# Patient Record
Sex: Female | Born: 1935 | Race: White | Hispanic: No | Marital: Married | Smoking: Former smoker
Health system: Southern US, Community
[De-identification: ages and names within clinical notes are randomized; demographics above are authoritative.]

## PROBLEM LIST (undated history)

## (undated) DIAGNOSIS — H269 Unspecified cataract: Secondary | ICD-10-CM

## (undated) DIAGNOSIS — Z9889 Other specified postprocedural states: Secondary | ICD-10-CM

## (undated) DIAGNOSIS — Z8719 Personal history of other diseases of the digestive system: Secondary | ICD-10-CM

## (undated) DIAGNOSIS — C3490 Malignant neoplasm of unspecified part of unspecified bronchus or lung: Secondary | ICD-10-CM

## (undated) HISTORY — DX: Unspecified cataract: H26.9

## (undated) HISTORY — PX: BLADDER SUSPENSION: SHX72

## (undated) HISTORY — DX: Malignant neoplasm of unspecified part of unspecified bronchus or lung: C34.90

## (undated) HISTORY — PX: ABDOMINAL HYSTERECTOMY: SHX81

## (undated) HISTORY — PX: CATARACT EXTRACTION: SUR2

## (undated) HISTORY — DX: Personal history of other diseases of the digestive system: Z87.19

## (undated) HISTORY — PX: HERNIA REPAIR: SHX51

## (undated) HISTORY — DX: Other specified postprocedural states: Z98.890

---

## 2014-06-13 ENCOUNTER — Other Ambulatory Visit: Payer: Self-pay | Admitting: Family Medicine

## 2014-06-13 ENCOUNTER — Ambulatory Visit
Admission: RE | Admit: 2014-06-13 | Discharge: 2014-06-13 | Disposition: A | Discharge status: Home / Self Care | Payer: Medicare Other | Source: Ambulatory Visit | Attending: Family Medicine | Admitting: Family Medicine

## 2014-06-13 DIAGNOSIS — R059 Cough, unspecified: Secondary | ICD-10-CM

## 2014-06-13 DIAGNOSIS — R05 Cough: Secondary | ICD-10-CM

## 2014-06-15 ENCOUNTER — Other Ambulatory Visit: Payer: Self-pay | Admitting: Family Medicine

## 2014-06-15 DIAGNOSIS — R9389 Abnormal findings on diagnostic imaging of other specified body structures: Secondary | ICD-10-CM

## 2014-06-19 ENCOUNTER — Ambulatory Visit
Admission: RE | Admit: 2014-06-19 | Discharge: 2014-06-19 | Disposition: A | Discharge status: Home / Self Care | Payer: Medicare Other | Source: Ambulatory Visit | Attending: Family Medicine | Admitting: Family Medicine

## 2014-06-19 DIAGNOSIS — R9389 Abnormal findings on diagnostic imaging of other specified body structures: Secondary | ICD-10-CM

## 2014-06-19 MED ORDER — IOHEXOL 300 MG/ML  SOLN
75.0000 mL | Freq: Once | INTRAMUSCULAR | Status: AC | PRN
  Administered 2014-06-19: 75 mL via INTRAVENOUS

## 2014-06-25 ENCOUNTER — Other Ambulatory Visit (HOSPITAL_COMMUNITY): Payer: Self-pay | Admitting: Family Medicine

## 2014-06-25 ENCOUNTER — Telehealth: Payer: Self-pay | Admitting: *Deleted

## 2014-06-25 ENCOUNTER — Encounter: Payer: Self-pay | Admitting: *Deleted

## 2014-06-25 DIAGNOSIS — R918 Other nonspecific abnormal finding of lung field: Secondary | ICD-10-CM

## 2014-06-25 NOTE — Telephone Encounter (Signed)
Called daughter per referral form request.  I left a vm message to call with my name and phone number

## 2014-06-25 NOTE — [facility] Oncology Navigator Note (Unsigned)
Daughter called me back.  She stated Lindsey Fleming does not want to be seen at this time.  I stated we are here if she changes her mind.  I called the referring office to notify.  They stated the daughter had called and stated the same thing.  I asked that they call me back if Lindsey Fleming changes her mind. Beverlee Nims stated she would.

## 2014-06-29 ENCOUNTER — Ambulatory Visit (HOSPITAL_COMMUNITY): Payer: Medicare Other

## 2014-09-13 ENCOUNTER — Other Ambulatory Visit (HOSPITAL_COMMUNITY): Payer: Self-pay | Admitting: Family Medicine

## 2014-09-13 DIAGNOSIS — R918 Other nonspecific abnormal finding of lung field: Secondary | ICD-10-CM

## 2014-09-14 ENCOUNTER — Telehealth: Payer: Self-pay | Admitting: *Deleted

## 2014-09-14 NOTE — Telephone Encounter (Signed)
Called patient to schedule appt.  I left vm message to call.    I called daughter whom I spoke with in February about appt.  She states mother does not want to schedule until after PET scan.  I gave her my phone number to call if she for when she would like to schedule.

## 2014-09-21 ENCOUNTER — Ambulatory Visit (HOSPITAL_COMMUNITY): Payer: Medicare Other

## 2014-10-05 ENCOUNTER — Ambulatory Visit (HOSPITAL_COMMUNITY): Payer: Medicare Other

## 2014-10-08 ENCOUNTER — Telehealth: Payer: Self-pay | Admitting: *Deleted

## 2014-10-08 NOTE — Telephone Encounter (Signed)
Oncology Nurse Navigator Documentation  Oncology Nurse Navigator Flowsheets 10/08/2014  Navigator Encounter Type Other  Patient Visit Type Initial  Treatment Phase Other  Barriers/Navigation Needs Education  Education Other  Interventions Coordination of Care.  Called daughter to arrange appt to see Dr. Julien Nordmann.  I left vm message with my name and phone number to call.  I also noted that PET scan was scheduled on 09/21/14 was cancelled and 10/05/14 was cancelled as well.    Coordination of Care MD Appointments  Time Spent with Patient 15

## 2014-11-06 ENCOUNTER — Telehealth: Payer: Self-pay | Admitting: *Deleted

## 2014-11-06 NOTE — Telephone Encounter (Signed)
Oncology Nurse Navigator Documentation  Oncology Nurse Navigator Flowsheets 11/06/2014  Navigator Encounter Type Telephone/I called daughter to follow up regarding referral.  I left vm message to call if needed.  I also call the referring office.  They stated they have closed out the referral and patient's daughter will follow up and let them know if they change their mind about referral.  I stated just let me know if I can help.    Treatment Phase Abnormal scans  Time Spent with Patient 15

## 2014-11-08 ENCOUNTER — Telehealth: Payer: Self-pay | Admitting: *Deleted

## 2014-11-08 NOTE — Telephone Encounter (Signed)
Oncology Nurse Navigator Documentation  Oncology Nurse Navigator Flowsheets 11/08/2014  Navigator Encounter Type Telephone  Treatment Phase Abnormal Scans  Interventions   Coordination of Care MD Appointments/I received a call from patient's daughter who would like to schedule an appt for her mother to see Dr. Julien Nordmann.  I called her back to schedule and was unable to leave a vm message.    Time Spent with Patient 15

## 2014-11-12 ENCOUNTER — Telehealth: Payer: Self-pay | Admitting: *Deleted

## 2014-11-12 NOTE — Telephone Encounter (Signed)
Daughter called and left message requesting to speak with Hinton Dyer, nurse lung navigator.  Spoke with daughter [name], and was informed that ever since pt received the diagnosis ,  pt has been in denial, and would not want to come in for office visit.  Now pt has seen blood in sputum again, pt voiced concerns and would like to have office follow up with Dr. Julien Nordmann. [facility]  Phone    [phone].

## 2014-11-13 ENCOUNTER — Telehealth: Payer: Self-pay | Admitting: Internal Medicine

## 2014-11-13 ENCOUNTER — Encounter: Payer: Self-pay | Admitting: *Deleted

## 2014-11-13 NOTE — Telephone Encounter (Signed)
NEW PATIENT APPT-S/W PATIENT DTR CAROL HARDEN AND GAVE NP APPT FOR 07/13 @ 1:45 W/DR. MOHAMED.

## 2014-11-13 NOTE — Progress Notes (Signed)
Oncology Nurse Navigator Documentation  Oncology Nurse Navigator Flowsheets 11/13/2014  Navigator Encounter Type Telephone/I received a message from triage stating patient's was coughing up blood.  I called daughter and left a vm message.  I told her that she should take her mom to the ED for evaluation.  I left [place] phone number to call if needed.    Treatment Phase Abnormal Scans  Barriers/Navigation Needs I have tried to set patient up for an appt since Feb. This year.  Unfortunately, patient has refused.  I will update HIM to schedule   Coordination of Care MD Appointments  Time Spent with Patient 15

## 2014-11-28 ENCOUNTER — Other Ambulatory Visit: Payer: Self-pay | Admitting: Medical Oncology

## 2014-11-28 ENCOUNTER — Other Ambulatory Visit: Payer: Self-pay | Admitting: Internal Medicine

## 2014-11-28 ENCOUNTER — Ambulatory Visit (HOSPITAL_BASED_OUTPATIENT_CLINIC_OR_DEPARTMENT_OTHER): Payer: Medicare Other | Admitting: Internal Medicine

## 2014-11-28 ENCOUNTER — Telehealth: Payer: Self-pay | Admitting: Internal Medicine

## 2014-11-28 ENCOUNTER — Other Ambulatory Visit (HOSPITAL_BASED_OUTPATIENT_CLINIC_OR_DEPARTMENT_OTHER): Payer: Medicare Other

## 2014-11-28 ENCOUNTER — Ambulatory Visit: Payer: Medicare Other

## 2014-11-28 ENCOUNTER — Encounter: Payer: Self-pay | Admitting: Internal Medicine

## 2014-11-28 VITALS — BP 147/73 | HR 131 | Temp 99.0°F | Resp 17 | Ht 65.0 in | Wt 157.4 lb

## 2014-11-28 DIAGNOSIS — J984 Other disorders of lung: Secondary | ICD-10-CM

## 2014-11-28 DIAGNOSIS — R918 Other nonspecific abnormal finding of lung field: Secondary | ICD-10-CM

## 2014-11-28 LAB — COMPREHENSIVE METABOLIC PANEL (CC13)
ALT: 7 U/L (ref 0–55)
AST: 11 U/L (ref 5–34)
Albumin: 3.1 g/dL — ABNORMAL LOW (ref 3.5–5.0)
Alkaline Phosphatase: 114 U/L (ref 40–150)
Anion Gap: 5 mEq/L (ref 3–11)
BUN: 14.8 mg/dL (ref 7.0–26.0)
CALCIUM: 9.2 mg/dL (ref 8.4–10.4)
CHLORIDE: 102 meq/L (ref 98–109)
CO2: 28 meq/L (ref 22–29)
CREATININE: 0.7 mg/dL (ref 0.6–1.1)
EGFR: 75 mL/min/{1.73_m2} — ABNORMAL LOW (ref >90)
GLUCOSE: 88 mg/dL (ref 70–140)
Potassium: 4.7 mEq/L (ref 3.5–5.1)
Sodium: 136 mEq/L (ref 136–145)
Total Bilirubin: 0.25 mg/dL (ref 0.20–1.20)
Total Protein: 7.4 g/dL (ref 6.4–8.3)

## 2014-11-28 LAB — CBC WITH DIFFERENTIAL/PLATELET
BASO%: 0.3 % (ref 0.0–2.0)
BASOS ABS: 0 10*3/uL (ref 0.0–0.1)
EOS ABS: 0.4 10*3/uL (ref 0.0–0.5)
EOS%: 3.5 % (ref 0.0–7.0)
HCT: 33.3 % — ABNORMAL LOW (ref 34.8–46.6)
HGB: 10.6 g/dL — ABNORMAL LOW (ref 11.6–15.9)
LYMPH%: 20.4 % (ref 14.0–49.7)
MCH: 28 pg (ref 25.1–34.0)
MCHC: 31.8 g/dL (ref 31.5–36.0)
MCV: 88.1 fL (ref 79.5–101.0)
MONO#: 1.1 10*3/uL — ABNORMAL HIGH (ref 0.1–0.9)
MONO%: 9.9 % (ref 0.0–14.0)
NEUT#: 7 10*3/uL — ABNORMAL HIGH (ref 1.5–6.5)
NEUT%: 65.9 % (ref 38.4–76.8)
Platelets: 451 10*3/uL — ABNORMAL HIGH (ref 145–400)
RBC: 3.78 10*6/uL (ref 3.70–5.45)
RDW: 15.6 % — AB (ref 11.2–14.5)
WBC: 10.6 10*3/uL — ABNORMAL HIGH (ref 3.9–10.3)
lymph#: 2.2 10*3/uL (ref 0.9–3.3)

## 2014-11-28 NOTE — Telephone Encounter (Signed)
cld Dr Elsworth Soho office they stated they have in [place] and they will call pt after Dr Elsworth Soho reads note to sch appt

## 2014-11-28 NOTE — Progress Notes (Signed)
[name]   [phone]  CONSULT NOTE  REFERRING PHYSICIAN: Dr. Gaynelle Fleming  REASON FOR CONSULTATION:  81 years old white female with large left lung mass suspicious for lung cancer.  HPI Lindsey Fleming is a 81 y.o. female with no significant past medical history and remote history of smoking for around 10 years but quit in 1974 who was evaluated by her primary care physician Dr. Marisue Fleming in January 2016 for complaining of chest tightness as well as cough with occasional hemoptysis. She also had some weight loss at that time. Chest x-ray performed on 06/13/2014 showed large irregular left upper lobe mass probably extending into the mediastinum consistent with carcinoma of the lung. This was followed by CT scan of the chest on 06/19/2014 and it showed a partially necrotic 9.9 x 8.7 x 5.7 cm spiculated mass in the medial aspect of the left upper lobe. The mass invades the anterior and middle mediastinum. The fat planes between the mass and the aortic arch and the aortopulmonary window and between the mass and the left main pulmonary artery are obliterated. The mass encases the bronchus to the superior aspect of the leftupper lobe and abuts the bronchus to the lingula of the left upper lobe. There is slight peripheral nodularity in the left lung apex which probably represents tumor.There is a subcarinal nodal mass 16 x 21 x 38 mm. There is an 8 mm node in the anterior mediastinum which is probably metastatic disease. There is several small nodes in the azygos region which are indeterminate in etiology. The patient was referred to me for evaluation of her condition but unfortunately she missed her appointment several times and was on denial that she has anything concerning. She recently had increased hemoptysis and she requested reevaluation. When seen today she is feeling fine except for fatigue as well as shortness breath with exertion and  occasional hemoptysis. She still have cough productive of yellowish sputum. She lost a few pounds recently. The patient denied having any headache or visual changes. Family history significant for a mother who died from headache and probably brain aneurysm. Father had stroke and abdominal aneurysm. She had a sister with bladder cancer, another sister with lymphoma and a brother with lung cancer. The patient is a widow and has 3 children. She was accompanied today by her daughter Lindsey Fleming. She was a housewife. She has a history of smoking 1 pack per day for around 10 years but quit in 1974. She has no history of alcohol or drug abuse.   HPI  Past Medical History  Diagnosis Date  . Cataract   . H/O hernia repair     Past Surgical History  Procedure Laterality Date  . Abdominal hysterectomy      Family History  Problem Relation Age of Onset  . Stroke Father   . Cancer Sister   . Cancer Brother     Social History History  Substance Use Topics  . Smoking status: Former Smoker -- 0.50 packs/day for 10 years    Quit date: 11/27/1972  . Smokeless tobacco: Never Used  . Alcohol Use: No    Not on File  Current Outpatient Prescriptions  Medication Sig Dispense Refill  . acetaminophen (TYLENOL) 650 MG CR tablet Take 650 mg by mouth every 8 (eight) hours as needed for pain.    . Pseudoeph-Doxylamine-DM-APAP (NYQUIL MULTI-SYMPTOM PO) Take by mouth as needed.     No current facility-administered medications for this visit.  Review of Systems  Constitutional: positive for fatigue and weight loss Eyes: negative Ears, nose, mouth, throat, and face: negative Respiratory: positive for cough, dyspnea on exertion, hemoptysis and sputum Cardiovascular: negative Gastrointestinal: negative Genitourinary:negative Integument/breast: negative Hematologic/lymphatic: negative Musculoskeletal:positive for muscle weakness Neurological: negative Behavioral/Psych: negative Endocrine:  negative Allergic/Immunologic: negative  Physical Exam  SHF:WYOVZ, healthy, no distress, well nourished, well developed and anxious SKIN: skin color, texture, turgor are normal, no rashes or significant lesions HEAD: Normocephalic, No masses, lesions, tenderness or abnormalities EYES: normal, PERRLA, Conjunctiva are pink and non-injected EARS: External ears normal, Canals clear OROPHARYNX:no exudate, no erythema and lips, buccal mucosa, and tongue normal  NECK: supple, no adenopathy, no JVD LYMPH:  no palpable lymphadenopathy, no hepatosplenomegaly BREAST:not examined LUNGS: clear to auscultation , and palpation HEART: regular rate & rhythm, no murmurs and no gallops ABDOMEN:abdomen soft, non-tender, normal bowel sounds and no masses or organomegaly BACK: Back symmetric, no curvature., No CVA tenderness EXTREMITIES:no joint deformities, effusion, or inflammation, no edema, no skin discoloration, no clubbing  NEURO: alert & oriented x 3 with fluent speech, no focal motor/sensory deficits  PERFORMANCE STATUS: ECOG 1  LABORATORY DATA: Lab Results  Component Value Date   WBC 10.6* 11/28/2014   HGB 10.6* 11/28/2014   HCT 33.3* 11/28/2014   MCV 88.1 11/28/2014   PLT 451* 11/28/2014      Chemistry      Component Value Date/Time   NA 136 11/28/2014 1349   K 4.7 11/28/2014 1349   CO2 28 11/28/2014 1349   BUN 14.8 11/28/2014 1349   CREATININE 0.7 11/28/2014 1349      Component Value Date/Time   CALCIUM 9.2 11/28/2014 1349   ALKPHOS 114 11/28/2014 1349   AST 11 11/28/2014 1349   ALT 7 11/28/2014 1349   BILITOT 0.25 11/28/2014 1349       RADIOGRAPHIC STUDIES: No results found.  ASSESSMENT: This is a very pleasant 81 years old white female with highly suspicious stage IIIA (T3, N2, M0) non-small cell lung cancer, pending tissue diagnosis presented with a large left upper lobe mass with questionable mediastinal invasion as well as mediastinal lymphadenopathy.   PLAN: I had  a lengthy discussion with the patient and her daughter today about her current condition and further investigation to confirm her diagnosis. I will complete the staging workup by ordering a PET scan as well as MRI of the brain. I will also refer the patient to Dr. Elsworth Soho for consideration of bronchoscopy with endobronchial ultrasound and biopsy. I would see the patient back for follow-up visit in 2 weeks for reevaluation and detailed discussion of her treatment options based on the final pathology and staging workup. The patient was advised to call immediately if she has any concerning symptoms in the interval. The patient voices understanding of current disease status and treatment options and is in agreement with the current care plan.  All questions were answered. The patient knows to call the clinic with any problems, questions or concerns. We can certainly see the patient much sooner if necessary.  Thank you so much for allowing me to participate in the care of Lindsey Fleming. I will continue to follow up the patient with you and assist in her care.  I spent 40 minutes counseling the patient face to face. The total time spent in the appointment was 60 minutes.  Disclaimer: This note was dictated with voice recognition software. Similar sounding words can inadvertently be transcribed and may not be corrected upon review.  Trueman Worlds K. November 28, 2014, 3:06 PM

## 2014-11-28 NOTE — Telephone Encounter (Signed)
per p[facility] to sch pt appt-adv pt that [facility] will cal to sch scans gave pt copy of avs

## 2014-11-29 ENCOUNTER — Telehealth: Payer: Self-pay | Admitting: Pulmonary Disease

## 2014-11-29 NOTE — Telephone Encounter (Signed)
Appointment scheduled with Dr. Elsworth Soho for tomorrow. Nothing further needed.

## 2014-11-29 NOTE — Telephone Encounter (Signed)
Called and left message on voicemail advising patient to call back to schedule appointment with Dr. Elsworth Soho for tomorrow morning. Per Dr. Elsworth Soho, ok to double book schedule tomorrow morning to get patient in for Consult. Awaiting call back from patient.

## 2014-11-29 NOTE — Telephone Encounter (Signed)
Referred by dr Earlie Server -LUL mass Please get her in on Friday am  - ok to doublebook

## 2014-11-30 ENCOUNTER — Encounter: Payer: Self-pay | Admitting: Pulmonary Disease

## 2014-11-30 ENCOUNTER — Ambulatory Visit (INDEPENDENT_AMBULATORY_CARE_PROVIDER_SITE_OTHER): Payer: Medicare Other | Admitting: Pulmonary Disease

## 2014-11-30 VITALS — BP 138/80 | HR 124 | Ht 65.0 in | Wt 157.4 lb

## 2014-11-30 DIAGNOSIS — R918 Other nonspecific abnormal finding of lung field: Secondary | ICD-10-CM

## 2014-11-30 DIAGNOSIS — J984 Other disorders of lung: Secondary | ICD-10-CM

## 2014-11-30 DIAGNOSIS — R590 Localized enlarged lymph nodes: Secondary | ICD-10-CM

## 2014-11-30 DIAGNOSIS — R599 Enlarged lymph nodes, unspecified: Secondary | ICD-10-CM

## 2014-11-30 NOTE — Progress Notes (Signed)
   Subjective:    Patient ID: Lindsey Fleming, female    DOB: [birth year], 81 y.o.   MRN: [id number]  HPI PCP- ehinger 81 year old remote smoker, referred by oncology for evaluation of lung mass and abnormal imaging.  she presented in January 2016 with chest tightness and blood-tinged sputum .chest x-ray showed left upper lobe mass extending to mediastinum   CT chest 06/19/2014 showed a large 10 cm 8.7 cm 5 cm spiculated mass in the left upper lobe invading the mediastinum encasing the bronchus and the left upper lobe. There was subcarinal node enlargement and other mediastinal lymphadenopathy. She is accompanied by her daughter [name] today. He does seem that she missed several oncology appointments and was in denial that she had any problem. She states that she has not had further episodes of hemoptysis. Her chest tightness is seemingly better. Her daughter also reports a 10 pound weight loss. She smoked about a pack per day for 20 years and quit in 1974. She had a brother with lung cancer who smoked, one sister had lymphoma and another sister had bladder cancer She has gait and balance issues per her daughter who is a retired Marine scientist She appeared to be tachycardic today  EKG-sinus tachycardia  Past Medical History  Diagnosis Date  . Cataract   . H/O hernia repair     Past Surgical History  Procedure Laterality Date  . Abdominal hysterectomy     No Known Allergies  History   Social History  . Marital Status: Married    Spouse Name: N/A  . Number of Children: N/A  . Years of Education: N/A   Occupational History  . Not on file.   Social History Main Topics  . Smoking status: Former Smoker -- 0.50 packs/day for 10 years    Quit date: 11/27/1972  . Smokeless tobacco: Never Used  . Alcohol Use: No  . Drug Use: No  . Sexual Activity: Not on file   Other Topics Concern  . Not on file   Social History Narrative    Family History  Problem Relation Age of Onset  . Stroke  Father   . Cancer Sister   . Cancer Brother     Review of Systems  Constitutional: Negative for fever, chills and unexpected weight change.  HENT: Positive for congestion. Negative for dental problem, ear pain, nosebleeds, postnasal drip, rhinorrhea, sinus pressure, sneezing, sore throat, trouble swallowing and voice change.   Eyes: Negative for visual disturbance.  Respiratory: Positive for cough and chest tightness. Negative for choking.   Cardiovascular: Negative for chest pain and leg swelling.  Gastrointestinal: Negative for vomiting, abdominal pain and diarrhea.  Genitourinary: Negative for difficulty urinating.  Musculoskeletal: Negative for arthralgias.  Skin: Negative for rash.  Neurological: Negative for tremors, syncope and headaches.  Hematological: Does not bruise/bleed easily.       Objective:   Physical Exam  Gen. Pleasant, well-nourished, in no distress, normal affect ENT - no lesions, no post nasal drip Neck: No JVD, no thyromegaly, no carotid bruits Lungs: no use of accessory muscles, no dullness to percussion, clear without rales or rhonchi  Cardiovascular: Rhythm regular, heart sounds  normal, no murmurs or gallops, no peripheral edema Abdomen: soft and non-tender, no hepatosplenomegaly, BS normal. Musculoskeletal: No deformities, no cyanosis or clubbing Neuro:  alert, non focal       Assessment & Plan:

## 2014-11-30 NOTE — Patient Instructions (Signed)
EKG We discussed bronchoscopy procedure & risks & benefits Nothing to eat after midnight the night before

## 2014-12-01 NOTE — Assessment & Plan Note (Signed)
PET scan has been scheduled to further evaluate But this appears to be at least stage IIIB based on this CAT scan

## 2014-12-01 NOTE — Assessment & Plan Note (Signed)
I reviewed images with the patient and her daughter [name]. This is likely advanced lung cancer with mediastinal involvement. This CAT scan is from February 2016, so does very likely that the tumor is further advanced by now, there does seem to be an endobronchial component in the left upper lobe which would be amenable to bronchoscopic biopsy. The various options of biopsy including bronchoscopy, CT guided needle aspiration and surgical biopsy were discussed.The risks of each procedure including coughing, bleeding and the  chances of lung puncture requiring chest tube were discussed in great detail. The benefits & alternatives including serial follow up were also discussed. Berniece does appear to be in some denial-surprisingly her symptoms have not progressed well, her daughter does seem to understand the prognosis. They were willing to proceed with bronchoscopy-since she is a remote smoker, the hope is that we may find some tumor markers which may be amenable to simple treatment

## 2014-12-03 ENCOUNTER — Telehealth: Payer: Self-pay | Admitting: Pulmonary Disease

## 2014-12-03 NOTE — Telephone Encounter (Signed)
Spoke with pt's daughter, [name]. Advised her of the instructions that I'm aware for bronchs. She has been given the number to respiratory ([phone]) to call and get detailed instructions. Nothing further was needed.

## 2014-12-04 ENCOUNTER — Ambulatory Visit (HOSPITAL_COMMUNITY): Payer: Medicare Other

## 2014-12-04 ENCOUNTER — Ambulatory Visit (HOSPITAL_COMMUNITY)
Admission: RE | Admit: 2014-12-04 | Discharge: 2014-12-04 | Disposition: A | Discharge status: Home / Self Care | Payer: Medicare Other | Source: Ambulatory Visit | Attending: Pulmonary Disease | Admitting: Pulmonary Disease

## 2014-12-04 ENCOUNTER — Encounter (HOSPITAL_COMMUNITY)
Admission: RE | Disposition: A | Discharge status: Home / Self Care | Payer: Self-pay | Source: Ambulatory Visit | Attending: Pulmonary Disease

## 2014-12-04 DIAGNOSIS — Z87891 Personal history of nicotine dependence: Secondary | ICD-10-CM | POA: Insufficient documentation

## 2014-12-04 DIAGNOSIS — R918 Other nonspecific abnormal finding of lung field: Secondary | ICD-10-CM

## 2014-12-04 DIAGNOSIS — D1432 Benign neoplasm of left bronchus and lung: Secondary | ICD-10-CM | POA: Insufficient documentation

## 2014-12-04 DIAGNOSIS — Z9889 Other specified postprocedural states: Secondary | ICD-10-CM

## 2014-12-04 HISTORY — PX: VIDEO BRONCHOSCOPY: SHX5072

## 2014-12-04 SURGERY — BRONCHOSCOPY, WITH FLUOROSCOPY
Anesthesia: Moderate Sedation | Laterality: Bilateral

## 2014-12-04 MED ORDER — LIDOCAINE HCL (PF) 1 % IJ SOLN
INTRAMUSCULAR | Status: DC | PRN
  Administered 2014-12-04: 6 mL

## 2014-12-04 MED ORDER — FENTANYL CITRATE (PF) 100 MCG/2ML IJ SOLN
INTRAMUSCULAR | Status: AC
  Filled 2014-12-04: qty 4

## 2014-12-04 MED ORDER — MIDAZOLAM HCL 5 MG/ML IJ SOLN
INTRAMUSCULAR | Status: AC
  Filled 2014-12-04: qty 2

## 2014-12-04 MED ORDER — SODIUM CHLORIDE 0.9 % IV SOLN
INTRAVENOUS | Status: DC
  Administered 2014-12-04: 10:00:00 via INTRAVENOUS

## 2014-12-04 MED ORDER — LIDOCAINE VISCOUS 2 % MT SOLN
OROMUCOSAL | Status: DC | PRN
  Administered 2014-12-04: 1 via OROMUCOSAL

## 2014-12-04 MED ORDER — PHENYLEPHRINE HCL 0.25 % NA SOLN
NASAL | Status: DC | PRN
  Administered 2014-12-04: 2 via NASAL

## 2014-12-04 MED ORDER — FENTANYL CITRATE (PF) 100 MCG/2ML IJ SOLN
25.0000 ug | INTRAMUSCULAR | Status: DC | PRN
  Administered 2014-12-04 (×4): 25 ug via INTRAVENOUS

## 2014-12-04 MED ORDER — MIDAZOLAM HCL 10 MG/2ML IJ SOLN
INTRAMUSCULAR | Status: DC | PRN
  Administered 2014-12-04 (×4): 1 mg via INTRAVENOUS

## 2014-12-04 NOTE — Interval H&P Note (Signed)
History and Physical Interval Note:  12/04/2014 10:05 AM  Lindsey Fleming  has presented today for surgery, with the diagnosis of Lung Nodule  The various methods of treatment have been discussed with the patient and family. After consideration of risks, benefits and other options for treatment, the patient has consented to  Procedure(s): VIDEO BRONCHOSCOPY WITH FLUORO (Bilateral) as a surgical intervention .  The patient's history has been reviewed, patient examined, no change in status, stable for surgery.  I have reviewed the patient's chart and labs.  Questions were answered to the patient's satisfaction.     Gemini Bunte V.

## 2014-12-04 NOTE — Op Note (Signed)
Indication for video bronchoscopy: LUL in the 81 -year-old remote smoker  Written informed consent was obtained from the patient prior to the procedure. The risks of the procedure including coughing, bleeding and a small chance of lung cancer requiring a chest tube were discussed with the patient in great detail and evidenced understanding.  4 mg of Versed and  100 mcg of fentanyl were used in divided doses during the procedure. Bronchoscope was inserted from the right Nare. The upper airway appeared normal. Vocal cord showed normal appearance, decreased motion of left vocal fold. The trachea bronchial tree was then examined to the subsegmental level. Minimal secretions were noted. No endobronchial lesions were noted but the LUL mucosa was coarse & LUL bronchus was narrowed down.  Attention was then turned to the left upper lobe. Bronchoalveolar lavage was obtained  with good return. Transbronchial biopsies x3 were obtained from the different subsegments of the left upper lobe. Forceps could not be advanced distally due to obstruction. The patient tolerated procedure well with some bleeding. Epinephrine diluted to 10 cc was instilled for hemostasis.  A portable chest XR. will be performed to rule out presence of pneumothorax. She was awake and alert in the end of the procedure.   Kara Mead MD. Shade Flood. [facility] Pager [phone] If no response call [phone]

## 2014-12-04 NOTE — Progress Notes (Signed)
Video bronchscopy with washing intervention, biospy intervention, brushing intervention. Pt. Did good thru out procedure all vitals good. Gave report to endo nurse.

## 2014-12-04 NOTE — H&P (View-Only) (Signed)
   Subjective:    Patient ID: Lindsey Fleming, female    DOB: 12-10-1927, 81 y.o.   MRN: [id number]  HPI PCP- ehinger 81 year old remote smoker, referred by oncology for evaluation of lung mass and abnormal imaging.  she presented in January 2016 with chest tightness and blood-tinged sputum .chest x-ray showed left upper lobe mass extending to mediastinum   CT chest 06/19/2014 showed a large 10 cm 8.7 cm 5 cm spiculated mass in the left upper lobe invading the mediastinum encasing the bronchus and the left upper lobe. There was subcarinal node enlargement and other mediastinal lymphadenopathy. She is accompanied by her daughter Lindsey Fleming today. He does seem that she missed several oncology appointments and was in denial that she had any problem. She states that she has not had further episodes of hemoptysis. Her chest tightness is seemingly better. Her daughter also reports a 10 pound weight loss. She smoked about a pack per day for 20 years and quit in 1974. She had a brother with lung cancer who smoked, one sister had lymphoma and another sister had bladder cancer She has gait and balance issues per her daughter who is a retired Marine scientist She appeared to be tachycardic today  EKG-sinus tachycardia  Past Medical History  Diagnosis Date  . Cataract   . H/O hernia repair     Past Surgical History  Procedure Laterality Date  . Abdominal hysterectomy     No Known Allergies  History   Social History  . Marital Status: Married    Spouse Name: N/A  . Number of Children: N/A  . Years of Education: N/A   Occupational History  . Not on file.   Social History Main Topics  . Smoking status: Former Smoker -- 0.50 packs/day for 10 years    Quit date: 11/27/1972  . Smokeless tobacco: Never Used  . Alcohol Use: No  . Drug Use: No  . Sexual Activity: Not on file   Other Topics Concern  . Not on file   Social History Narrative    Family History  Problem Relation Age of Onset  . Stroke  Father   . Cancer Sister   . Cancer Brother     Review of Systems  Constitutional: Negative for fever, chills and unexpected weight change.  HENT: Positive for congestion. Negative for dental problem, ear pain, nosebleeds, postnasal drip, rhinorrhea, sinus pressure, sneezing, sore throat, trouble swallowing and voice change.   Eyes: Negative for visual disturbance.  Respiratory: Positive for cough and chest tightness. Negative for choking.   Cardiovascular: Negative for chest pain and leg swelling.  Gastrointestinal: Negative for vomiting, abdominal pain and diarrhea.  Genitourinary: Negative for difficulty urinating.  Musculoskeletal: Negative for arthralgias.  Skin: Negative for rash.  Neurological: Negative for tremors, syncope and headaches.  Hematological: Does not bruise/bleed easily.       Objective:   Physical Exam  Gen. Pleasant, well-nourished, in no distress, normal affect ENT - no lesions, no post nasal drip Neck: No JVD, no thyromegaly, no carotid bruits Lungs: no use of accessory muscles, no dullness to percussion, clear without rales or rhonchi  Cardiovascular: Rhythm regular, heart sounds  normal, no murmurs or gallops, no peripheral edema Abdomen: soft and non-tender, no hepatosplenomegaly, BS normal. Musculoskeletal: No deformities, no cyanosis or clubbing Neuro:  alert, non focal       Assessment & Plan:

## 2014-12-06 ENCOUNTER — Telehealth: Payer: Self-pay | Admitting: Pulmonary Disease

## 2014-12-06 ENCOUNTER — Encounter (HOSPITAL_COMMUNITY): Payer: Self-pay | Admitting: Pulmonary Disease

## 2014-12-06 LAB — CULTURE, BAL-QUANTITATIVE: SPECIAL REQUESTS: NORMAL

## 2014-12-06 LAB — CULTURE, BAL-QUANTITATIVE W GRAM STAIN: Colony Count: >=100000

## 2014-12-06 NOTE — Telephone Encounter (Signed)
I called spoke with [name]. She reports RA had called her and was calling him back. Please advise thanks

## 2014-12-10 ENCOUNTER — Telehealth: Payer: Self-pay | Admitting: *Deleted

## 2014-12-10 NOTE — Telephone Encounter (Signed)
I spoke to Lindsey Fleming gave biopsy results -non diagnostic Await PEt scan She will call back if she wants to pursue another biopsy

## 2014-12-10 NOTE — Telephone Encounter (Signed)
Attempted to call patient's daughter back, line busy. RA - have you spoken with her yet?  Can I close this encounter?

## 2014-12-10 NOTE — Telephone Encounter (Signed)
Patient's daughter called asking to speak with Norton Blizzard.  "This is in reference to her upcoming scans.  I just need to talk to her."  Call transferred.  Voicemail picked up call.

## 2014-12-10 NOTE — Telephone Encounter (Signed)
Oncology Nurse Navigator Documentation  Oncology Nurse Navigator Flowsheets 12/10/2014  Navigator Encounter Type Telephone/Daughter called today.  She is not sure about getting scans due to no dx.  Bronch was non-diagnostic and daughter said patient does not sure about scans.  I explained that tissue dx for lung cancer can be hard and that there are other ways to get dx.  I discussed that the sans will give [place] information to help treat the patient.  Daughter stated mother is weak and that she did want to do scans.  I stated it may be the cancer causing her to be weak.  Daughter is not sure her mother will get scans.  I encouraged her to complete work up but that it is her mothers decision to proceed.    Patient Visit Type Follow-up  Treatment Phase Abnormal Scans  Barriers/Navigation Needs Education  Education Understanding Cancer/ Treatment Options  Interventions Education Method  Time Spent with Patient 30

## 2014-12-11 ENCOUNTER — Ambulatory Visit (HOSPITAL_COMMUNITY)
Admission: RE | Admit: 2014-12-11 | Discharge: 2014-12-11 | Disposition: A | Discharge status: Home / Self Care | Payer: Medicare Other | Source: Ambulatory Visit | Attending: Internal Medicine | Admitting: Internal Medicine

## 2014-12-11 DIAGNOSIS — J984 Other disorders of lung: Secondary | ICD-10-CM

## 2014-12-11 DIAGNOSIS — C7931 Secondary malignant neoplasm of brain: Secondary | ICD-10-CM | POA: Insufficient documentation

## 2014-12-11 MED ORDER — GADOBENATE DIMEGLUMINE 529 MG/ML IV SOLN
14.0000 mL | Freq: Once | INTRAVENOUS | Status: AC | PRN
  Administered 2014-12-11: 14 mL via INTRAVENOUS

## 2014-12-12 ENCOUNTER — Encounter (HOSPITAL_COMMUNITY): Payer: Medicare Other

## 2014-12-14 ENCOUNTER — Ambulatory Visit: Payer: Medicare Other | Admitting: Pulmonary Disease

## 2014-12-21 ENCOUNTER — Encounter (HOSPITAL_COMMUNITY)
Admission: RE | Admit: 2014-12-21 | Discharge: 2014-12-21 | Disposition: A | Discharge status: Home / Self Care | Payer: Medicare Other | Source: Ambulatory Visit | Attending: Internal Medicine | Admitting: Internal Medicine

## 2014-12-21 DIAGNOSIS — R918 Other nonspecific abnormal finding of lung field: Secondary | ICD-10-CM | POA: Diagnosis not present

## 2014-12-21 DIAGNOSIS — J984 Other disorders of lung: Secondary | ICD-10-CM

## 2014-12-21 LAB — GLUCOSE, CAPILLARY: Glucose-Capillary: 107 mg/dL — ABNORMAL HIGH (ref 65–99)

## 2014-12-21 MED ORDER — FLUDEOXYGLUCOSE F - 18 (FDG) INJECTION
7.7800 | Freq: Once | INTRAVENOUS | Status: AC | PRN
  Administered 2014-12-21: 7.78 via INTRAVENOUS

## 2014-12-24 ENCOUNTER — Telehealth: Payer: Self-pay | Admitting: Internal Medicine

## 2014-12-24 ENCOUNTER — Ambulatory Visit (HOSPITAL_BASED_OUTPATIENT_CLINIC_OR_DEPARTMENT_OTHER): Payer: Medicare Other | Admitting: Internal Medicine

## 2014-12-24 ENCOUNTER — Encounter: Payer: Self-pay | Admitting: Internal Medicine

## 2014-12-24 ENCOUNTER — Other Ambulatory Visit (HOSPITAL_BASED_OUTPATIENT_CLINIC_OR_DEPARTMENT_OTHER): Payer: Medicare Other

## 2014-12-24 VITALS — BP 142/60 | HR 103 | Temp 99.0°F | Resp 18 | Ht 65.0 in | Wt 157.0 lb

## 2014-12-24 DIAGNOSIS — J984 Other disorders of lung: Secondary | ICD-10-CM

## 2014-12-24 DIAGNOSIS — R918 Other nonspecific abnormal finding of lung field: Secondary | ICD-10-CM

## 2014-12-24 LAB — CBC WITH DIFFERENTIAL/PLATELET
BASO%: 0.1 % (ref 0.0–2.0)
BASOS ABS: 0 10*3/uL (ref 0.0–0.1)
EOS%: 2.8 % (ref 0.0–7.0)
Eosinophils Absolute: 0.3 10*3/uL (ref 0.0–0.5)
HEMATOCRIT: 33.5 % — AB (ref 34.8–46.6)
HEMOGLOBIN: 10.6 g/dL — AB (ref 11.6–15.9)
LYMPH#: 2 10*3/uL (ref 0.9–3.3)
LYMPH%: 20.7 % (ref 14.0–49.7)
MCH: 28 pg (ref 25.1–34.0)
MCHC: 31.6 g/dL (ref 31.5–36.0)
MCV: 88.4 fL (ref 79.5–101.0)
MONO#: 0.9 10*3/uL (ref 0.1–0.9)
MONO%: 9.6 % (ref 0.0–14.0)
NEUT%: 66.8 % (ref 38.4–76.8)
NEUTROS ABS: 6.5 10*3/uL (ref 1.5–6.5)
Platelets: 408 10*3/uL — ABNORMAL HIGH (ref 145–400)
RBC: 3.79 10*6/uL (ref 3.70–5.45)
RDW: 15.6 % — ABNORMAL HIGH (ref 11.2–14.5)
WBC: 9.7 10*3/uL (ref 3.9–10.3)

## 2014-12-24 LAB — COMPREHENSIVE METABOLIC PANEL (CC13)
ALBUMIN: 3.1 g/dL — AB (ref 3.5–5.0)
ALT: 11 U/L (ref 0–55)
AST: 10 U/L (ref 5–34)
Alkaline Phosphatase: 116 U/L (ref 40–150)
Anion Gap: 6 mEq/L (ref 3–11)
BUN: 13 mg/dL (ref 7.0–26.0)
CHLORIDE: 102 meq/L (ref 98–109)
CO2: 29 mEq/L (ref 22–29)
CREATININE: 0.7 mg/dL (ref 0.6–1.1)
Calcium: 9.2 mg/dL (ref 8.4–10.4)
EGFR: 76 mL/min/{1.73_m2} — ABNORMAL LOW (ref >90)
GLUCOSE: 95 mg/dL (ref 70–140)
Potassium: 4.8 mEq/L (ref 3.5–5.1)
Sodium: 137 mEq/L (ref 136–145)
TOTAL PROTEIN: 7.3 g/dL (ref 6.4–8.3)
Total Bilirubin: 0.24 mg/dL (ref 0.20–1.20)

## 2014-12-24 NOTE — Progress Notes (Signed)
[facility] [phone]   [phone]  OFFICE PROGRESS NOTE  Simona Huh, MD [address] [place] [address]  DIAGNOSIS: Highly suspicious of stage III lung cancer, pending tissue diagnosis  PRIOR THERAPY: None  CURRENT THERAPY: None  INTERVAL HISTORY: Lindsey Fleming 81 y.o. female returns to the clinic today for follow-up visit accompanied by her daughter and son. The patient is feeling fine today with no specific complaints except for mild cough but no hemoptysis. She has mild shortness breath with exertion. She denied having any significant chest pain. The patient has no significant weight loss or night sweats. She has no fever or chills. She has no nausea or vomiting. She had several studies performed recently including MRI of the brain as well as PET scan. She also had bronchoscopy with biopsies performed by Dr. Elsworth Soho but the final pathology was not conclusive for malignancy. She is here today for evaluation and discussion of her scan results and treatment options.  MEDICAL HISTORY: Past Medical History  Diagnosis Date  . Cataract   . H/O hernia repair     ALLERGIES:  has No Known Allergies.  MEDICATIONS:  Current Outpatient Prescriptions  Medication Sig Dispense Refill  . acetaminophen (TYLENOL) 650 MG CR tablet Take 650 mg by mouth every 8 (eight) hours as needed for pain.    Marland Kitchen LORazepam (ATIVAN) 0.5 MG tablet Take 0.5 mg by mouth 2 (two) times daily as needed.  0  . multivitamin-iron-minerals-folic acid (CENTRUM) chewable tablet Chew 1 tablet by mouth daily.    . Pseudoeph-Doxylamine-DM-APAP (NYQUIL MULTI-SYMPTOM PO) Take by mouth as needed.     No current facility-administered medications for this visit.    SURGICAL HISTORY:  Past Surgical History  Procedure Laterality Date  . Abdominal hysterectomy    . Video bronchoscopy Bilateral 12/04/2014    Procedure: VIDEO BRONCHOSCOPY WITH FLUORO;  Surgeon:  Rigoberto Noel, MD;  Location: [facility];  Service: Cardiopulmonary;  Laterality: Bilateral;    REVIEW OF SYSTEMS:  Constitutional: negative Eyes: negative Ears, nose, mouth, throat, and face: negative Respiratory: positive for cough and dyspnea on exertion Cardiovascular: negative Gastrointestinal: negative Genitourinary:negative Integument/breast: negative Hematologic/lymphatic: negative Musculoskeletal:negative Neurological: negative Behavioral/Psych: negative Endocrine: negative Allergic/Immunologic: negative   PHYSICAL EXAMINATION: General appearance: alert, cooperative and no distress Head: Normocephalic, without obvious abnormality, atraumatic Neck: no adenopathy, no JVD, supple, symmetrical, trachea midline and thyroid not enlarged, symmetric, no tenderness/mass/nodules Lymph nodes: Cervical, supraclavicular, and axillary nodes normal. Resp: clear to auscultation bilaterally Back: symmetric, no curvature. ROM normal. No CVA tenderness. Cardio: regular rate and rhythm, S1, S2 normal, no murmur, click, rub or gallop GI: soft, non-tender; bowel sounds normal; no masses,  no organomegaly Extremities: extremities normal, atraumatic, no cyanosis or edema Neurologic: Alert and oriented X 3, normal strength and tone. Normal symmetric reflexes. Normal coordination and gait  ECOG PERFORMANCE STATUS: 1 - Symptomatic but completely ambulatory  Blood pressure 142/60, pulse 103, temperature 99 F (37.2 C), temperature source Oral, resp. rate 18, height '5\' 5"'$  (1.651 m), weight 157 lb (71.215 kg), SpO2 97 %.  LABORATORY DATA: Lab Results  Component Value Date   WBC 9.7 12/24/2014   HGB 10.6* 12/24/2014   HCT 33.5* 12/24/2014   MCV 88.4 12/24/2014   PLT 408* 12/24/2014      Chemistry      Component Value Date/Time   NA 137 12/24/2014 1450   K 4.8 12/24/2014 1450   CO2 29 12/24/2014  1450   BUN 13.0 12/24/2014 1450   CREATININE 0.7 12/24/2014 1450      Component Value  Date/Time   CALCIUM 9.2 12/24/2014 1450   ALKPHOS 116 12/24/2014 1450   AST 10 12/24/2014 1450   ALT 11 12/24/2014 1450   BILITOT 0.24 12/24/2014 1450       RADIOGRAPHIC STUDIES: Mr Jeri Cos XV Contrast  12/22/2014   CLINICAL DATA:  Suspected lung cancer. Staging for brain metastases.  EXAM: MRI HEAD WITHOUT AND WITH CONTRAST  TECHNIQUE: Multiplanar, multiecho pulse sequences of the brain and surrounding structures were obtained without and with intravenous contrast.  CONTRAST:  44m MULTIHANCE GADOBENATE DIMEGLUMINE 529 MG/ML IV SOLN  COMPARISON:  None.  FINDINGS: No evidence for acute infarction, hemorrhage, mass lesion, hydrocephalus, or extra-axial fluid. Moderate cerebral and cerebellar atrophy. Mild subcortical and periventricular T2 and FLAIR hyperintensities, likely chronic microvascular ischemic change. Flow voids are maintained throughout the carotid, basilar, and vertebral arteries. There are no areas of chronic hemorrhage. Pineal and cerebellar tonsils unremarkable. Partial empty sella. Mild cervical spondylosis.  Post infusion, no abnormal enhancement of the brain or meninges. Visualized calvarium, skull base, and upper cervical osseous structures unremarkable. Scalp and extracranial soft tissues, orbits, sinuses, and mastoids show no acute process.  Visualized calvarium, skull base, and upper cervical osseous structures unremarkable. Scalp and extracranial soft tissues, orbits, sinuses, and mastoids show no acute process.  IMPRESSION: Atrophy and small vessel disease.  No acute intracranial findings.  No metastatic disease to the brain or its coverings are evident.   Electronically Signed   By: JStaci RighterM.D.   On: 02016/12/612:05   Nm Pet Image Initial (pi) Skull Base To Thigh  12/21/2014   CLINICAL DATA:  Initial treatment strategy for left upper lobe lung carcinoma.  EXAM: NUCLEAR MEDICINE PET SKULL BASE TO THIGH  TECHNIQUE: 7.8 mCi F-18 FDG was injected intravenously. Full-ring  PET imaging was performed from the skull base to thigh after the radiotracer. CT data was obtained and used for attenuation correction and anatomic localization.  FASTING BLOOD GLUCOSE:  Value: 107 mg/dl  COMPARISON:  Chest CT on 06/19/2014  FINDINGS: NECK  No hypermetabolic lymph nodes in the neck.  CHEST  A large hypermetabolic central left upper lobe mass is seen which measures 9.6 cm and shows central necrosis. This has an SUV max of 20.3. This mass also appears to show invasion of the mediastinum, left hilum, and possibly the left anterior chest wall.  Mild hypermetabolic lymphadenopathy is seen in the prevascular and right paratracheal spaces. Largest right paratracheal lymph node measuring 1.2 cm, with SUV max of 3.7. Mild hypermetabolic lymphadenopathy also seen in both hilar regions.  ABDOMEN/PELVIS  No abnormal hypermetabolic activity within the liver, pancreas, adrenal glands, or spleen. No hypermetabolic lymph nodes in the abdomen or pelvis.  Moderate right hydronephrosis and mild right renal parenchymal atrophy is seen. No evidence of ureteral dilatation or calculi. This may be due to a chronic low-grade UPJ obstruction. A moderate to large left inguinal hernia is also seen containing descending and sigmoid colon. No evidence of bowel obstruction or ischemia colonic diverticulosis also noted, however there is no evidence of diverticulitis.  SKELETON  No focal hypermetabolic activity to suggest skeletal metastasis.  IMPRESSION: 9.6 cm hypermetabolic mass with central necrosis in the central left upper lobe, consistent with primary bronchogenic carcinoma. This mass shows mediastinal invasion, left hilar involvement, and possible left anterior chest wall invasion.  Mild hypermetabolic mediastinal and bilateral hilar lymphadenopathy,  consistent with metastatic disease.  No evidence of metastatic disease within the neck, abdomen, or pelvis.  Moderate right hydronephrosis and mild renal parenchymal atrophy.  No etiology visualized, which raises suspicion for chronic low-grade UPJ obstruction.  Moderate to large left inguinal hernia containing colon.   Electronically Signed   By: Earle Gell M.D.   On: 12/21/2014 13:20   Dg Chest Port 1 View  12/04/2014   CLINICAL DATA:  Status post bronchoscopy, known left upper lobe mass  EXAM: PORTABLE CHEST - 1 VIEW  COMPARISON:  CT scan of the chest of June 19, 2014 and chest x-ray of June 13, 2014  FINDINGS: There is persistent left upper lobe mass abutting the mediastinum with displacement of the trachea toward the right. There is no postprocedure pneumothorax or hemo thorax. There is density at the right lung base which corresponds to a posterior diaphragmatic hernia on the right with a large amount of mesenteric fat herniating into the right pleural space. The cardiac silhouette is mildly enlarged. The pulmonary vascularity is not engorged. The bony thorax exhibits no acute abnormality.  IMPRESSION: No postprocedure complication following bronchoscopy. There is an persistent large left upper lobe mass abutting the mediastinum.   Electronically Signed   By: David  Martinique M.D.   On: 12/04/2014 11:42   Dg C-arm Bronchoscopy  12/04/2014   CLINICAL DATA:    C-ARM BRONCHOSCOPY  Fluoroscopy was utilized by the requesting physician.  No radiographic  interpretation.     ASSESSMENT AND PLAN: This is a very pleasant 81 years old white female with highly suspicious for stage III lung cancer. The patient has no confirm tissue diagnosis at this point. She underwent bronchoscopy by Dr. tablet was not conclusive for malignancy. The recent MRI of the brain showed no evidence for metastatic disease to the brain and a PET scan showed a large 9.6 cm hypermetabolic mass with central necrosis in the central left upper lobe consistent with primary bronchogenic carcinoma with mediastinal invasion as well as left hilar involvement and possible left anterior chest wall invasion. There  was also mild hypermetabolic mediastinal and bilateral hilar lymphadenopathy consistent with metastatic disease but no evidence for metastatic disease in the neck, abdomen or pelvis. I discussed the scan results and showed the images to the patient and her family. I recommended for her to proceed with CT-guided core biopsy of the left upper lobe lung mass for confirmation of the tissue diagnosis before discussion of any further treatment. The patient may be considered for a course of concurrent chemoradiation if the final pathology was consistent with non-small cell lung cancer. She will come back for follow-up visit in 2 weeks for reevaluation and discussion of her biopsy results as well as treatment options. She was advised to call immediately if she has any concerning symptoms in the interval. The patient voices understanding of current disease status and treatment options and is in agreement with the current care plan.  All questions were answered. The patient knows to call the clinic with any problems, questions or concerns. We can certainly see the patient much sooner if necessary.  I spent 15 minutes counseling the patient face to face. The total time spent in the appointment was 25 minutes.  Disclaimer: This note was dictated with voice recognition software. Similar sounding words can inadvertently be transcribed and may not be corrected upon review.

## 2014-12-24 NOTE — Telephone Encounter (Signed)
Central radiology schedulers will contact patient re bx - pt/relative aware. Message to MM re date for 2 week f/u. No availability. Patient/relative aware I will call re f/u appointment.

## 2014-12-28 ENCOUNTER — Telehealth: Payer: Self-pay | Admitting: Internal Medicine

## 2014-12-28 NOTE — Telephone Encounter (Signed)
Per MM ok to use an MD only slot the week of 8/22. Spoke with dtr re appointment for 8/23 @ 11 am. dtr confirmed she has been contacted with bx appointment.

## 2014-12-31 ENCOUNTER — Other Ambulatory Visit: Payer: Self-pay | Admitting: Radiology

## 2015-01-01 ENCOUNTER — Encounter (HOSPITAL_COMMUNITY): Payer: Self-pay

## 2015-01-01 ENCOUNTER — Ambulatory Visit (HOSPITAL_COMMUNITY)
Admission: RE | Admit: 2015-01-01 | Discharge: 2015-01-01 | Disposition: A | Discharge status: Home / Self Care | Payer: Medicare Other | Source: Ambulatory Visit | Attending: Internal Medicine | Admitting: Internal Medicine

## 2015-01-01 ENCOUNTER — Ambulatory Visit (HOSPITAL_COMMUNITY)
Admission: RE | Admit: 2015-01-01 | Discharge: 2015-01-01 | Disposition: A | Discharge status: Home / Self Care | Payer: Medicare Other | Source: Ambulatory Visit | Attending: Interventional Radiology | Admitting: Interventional Radiology

## 2015-01-01 DIAGNOSIS — K449 Diaphragmatic hernia without obstruction or gangrene: Secondary | ICD-10-CM | POA: Diagnosis not present

## 2015-01-01 DIAGNOSIS — R590 Localized enlarged lymph nodes: Secondary | ICD-10-CM | POA: Diagnosis not present

## 2015-01-01 DIAGNOSIS — J984 Other disorders of lung: Secondary | ICD-10-CM

## 2015-01-01 DIAGNOSIS — Z87891 Personal history of nicotine dependence: Secondary | ICD-10-CM | POA: Insufficient documentation

## 2015-01-01 DIAGNOSIS — I517 Cardiomegaly: Secondary | ICD-10-CM | POA: Diagnosis not present

## 2015-01-01 DIAGNOSIS — R918 Other nonspecific abnormal finding of lung field: Secondary | ICD-10-CM | POA: Insufficient documentation

## 2015-01-01 DIAGNOSIS — Z809 Family history of malignant neoplasm, unspecified: Secondary | ICD-10-CM | POA: Diagnosis not present

## 2015-01-01 DIAGNOSIS — Z9889 Other specified postprocedural states: Secondary | ICD-10-CM

## 2015-01-01 LAB — FUNGUS CULTURE W SMEAR
FUNGAL SMEAR: NONE SEEN
Special Requests: NORMAL

## 2015-01-01 LAB — CBC
HCT: 34.8 % — ABNORMAL LOW (ref 36.0–46.0)
Hemoglobin: 10.9 g/dL — ABNORMAL LOW (ref 12.0–15.0)
MCH: 27.8 pg (ref 26.0–34.0)
MCHC: 31.3 g/dL (ref 30.0–36.0)
MCV: 88.8 fL (ref 78.0–100.0)
PLATELETS: 474 10*3/uL — AB (ref 150–400)
RBC: 3.92 MIL/uL (ref 3.87–5.11)
RDW: 15.7 % — AB (ref 11.5–15.5)
WBC: 10.5 10*3/uL (ref 4.0–10.5)

## 2015-01-01 LAB — PROTIME-INR
INR: 1.06 (ref 0.00–1.49)
PROTHROMBIN TIME: 14 s (ref 11.6–15.2)

## 2015-01-01 LAB — APTT: APTT: 28 s (ref 24–37)

## 2015-01-01 MED ORDER — FENTANYL CITRATE (PF) 100 MCG/2ML IJ SOLN
INTRAMUSCULAR | Status: AC
  Filled 2015-01-01: qty 2

## 2015-01-01 MED ORDER — MIDAZOLAM HCL 2 MG/2ML IJ SOLN
INTRAMUSCULAR | Status: AC | PRN
  Administered 2015-01-01: 0.5 mg via INTRAVENOUS

## 2015-01-01 MED ORDER — HYDROCODONE-ACETAMINOPHEN 5-325 MG PO TABS
1.0000 | ORAL_TABLET | ORAL | Status: DC | PRN
  Filled 2015-01-01: qty 2

## 2015-01-01 MED ORDER — MIDAZOLAM HCL 2 MG/2ML IJ SOLN
INTRAMUSCULAR | Status: AC
  Filled 2015-01-01: qty 2

## 2015-01-01 MED ORDER — SODIUM CHLORIDE 0.9 % IV SOLN
INTRAVENOUS | Status: DC
  Administered 2015-01-01: 09:00:00 via INTRAVENOUS

## 2015-01-01 MED ORDER — FENTANYL CITRATE (PF) 100 MCG/2ML IJ SOLN
INTRAMUSCULAR | Status: AC | PRN
  Administered 2015-01-01: 25 ug via INTRAVENOUS

## 2015-01-01 NOTE — Discharge Instructions (Signed)
Needle Biopsy of Lung, Care After °Refer to this sheet in the next few weeks. These instructions provide you with information on caring for yourself after your procedure. Your health care provider may also give you more specific instructions. Your treatment has been planned according to current medical practices, but problems sometimes occur. Call your health care provider if you have any problems or questions after your procedure. °WHAT TO EXPECT AFTER THE PROCEDURE °· A bandage will be applied over the area where the needle was inserted. You may be asked to apply pressure to the bandage for several minutes to ensure there is minimal bleeding. °· In most cases, you can leave when your needle biopsy procedure is completed. Do not drive yourself home. Someone else should take you home. °· If you received an IV sedative or general anesthetic, you will be taken to a comfortable place to relax while the medicine wears off. °· If you have upcoming travel scheduled, talk to your health care provider about when it is safe to travel by air after the procedure. °HOME CARE INSTRUCTIONS °· Expect to take it easy for the rest of the day. °· Protect the area where you received the needle biopsy by keeping the bandage in place for as long as instructed. °· You may feel some mild pain or discomfort in the area, but this should stop in a day or two. °· Take medicines only as directed by your health care provider. °SEEK MEDICAL CARE IF:  °· You have pain at the biopsy site that worsens or is not helped by medicine. °· You have swelling or drainage at the needle biopsy site. °· You have a fever. °SEEK IMMEDIATE MEDICAL CARE IF:  °· You have new or worsening shortness of breath. °· You have chest pain. °· You are coughing up blood. °· You have bleeding that does not stop with pressure or a bandage. °· You develop light-headedness or fainting. °Document Released: 03/01/2007 Document Revised: 09/18/2013 Document Reviewed:  09/26/2012 °[employer]® Patient Information ©2015 [employer]. This information is not intended to replace advice given to you by your health care provider. Make sure you discuss any questions you have with your health care provider. °Conscious Sedation, Adult, Care After °Refer to this sheet in the next few weeks. These instructions provide you with information on caring for yourself after your procedure. Your health care provider may also give you more specific instructions. Your treatment has been planned according to current medical practices, but problems sometimes occur. Call your health care provider if you have any problems or questions after your procedure. °WHAT TO EXPECT AFTER THE PROCEDURE  °After your procedure: °· You may feel sleepy, clumsy, and have poor balance for several hours. °· Vomiting may occur if you eat too soon after the procedure. °HOME CARE INSTRUCTIONS °· Do not participate in any activities where you could become injured for at least 24 hours. Do not: °¨ Drive. °¨ Swim. °¨ Ride a bicycle. °¨ Operate heavy machinery. °¨ Cook. °¨ Use power tools. °¨ Climb ladders. °¨ Work from a high place. °· Do not make important decisions or sign legal documents until you are improved. °· If you vomit, drink water, juice, or soup when you can drink without vomiting. Make sure you have little or no nausea before eating solid foods. °· Only take over-the-counter or prescription medicines for pain, discomfort, or fever as directed by your health care provider. °· Make sure you and your family fully understand everything about the   medicines given to you, including what side effects may occur. °· You should not drink alcohol, take sleeping pills, or take medicines that cause drowsiness for at least 24 hours. °· If you smoke, do not smoke without supervision. °· If you are feeling better, you may resume normal activities 24 hours after you were sedated. °· Keep all appointments with your health care  provider. °SEEK MEDICAL CARE IF: °· Your skin is pale or bluish in color. °· You continue to feel nauseous or vomit. °· Your pain is getting worse and is not helped by medicine. °· You have bleeding or swelling. °· You are still sleepy or feeling clumsy after 24 hours. °SEEK IMMEDIATE MEDICAL CARE IF: °· You develop a rash. °· You have difficulty breathing. °· You develop any type of allergic problem. °· You have a fever. °MAKE SURE YOU: °· Understand these instructions. °· Will watch your condition. °· Will get help right away if you are not doing well or get worse. °Document Released: 02/22/2013 Document Reviewed: 02/22/2013 °Exit[employer]® Patient Information ©2015 [employer]. This information is not intended to replace advice given to you by your health care provider. Make sure you discuss any questions you have with your health care provider. ° °

## 2015-01-01 NOTE — Procedures (Signed)
CT core bx LUL mass 18g x4 to surg path No complication No blood loss. See complete dictation in [facility].

## 2015-01-01 NOTE — H&P (Signed)
Chief Complaint: Patient was seen in consultation today for Left lung mass at the request of [facility]  Referring Physician(s): Mohamed,Mohamed  History of Present Illness: Lindsey Fleming is a 81 y.o. female   Pt had seen MD regarding hemoptysis and cough Jan 2016 Discovered LUL mass on CXR then CT 06/2014 confirmed finding Pt opted against work up at that time In 11/2014 work up was initiated by Dr Julien Nordmann with pts consent Bronchial washings neg 12/04/14 +PET 12/21/14: IMPRESSION: 9.6 cm hypermetabolic mass with central necrosis in the central left upper lobe, consistent with primary bronchogenic carcinoma. This mass shows mediastinal invasion, left hilar involvement, and possible left anterior chest wall invasion.  Mild hypermetabolic mediastinal and bilateral hilar lymphadenopathy, consistent with metastatic disease.  Now scheduled for LUL mass bx   Past Medical History  Diagnosis Date  . Cataract   . H/O hernia repair     Past Surgical History  Procedure Laterality Date  . Abdominal hysterectomy    . Video bronchoscopy Bilateral 12/04/2014    Procedure: VIDEO BRONCHOSCOPY WITH FLUORO;  Surgeon: Rigoberto Noel, MD;  Location: [place];  Service: Cardiopulmonary;  Laterality: Bilateral;    Allergies: Review of patient's allergies indicates no known allergies.  Medications: Prior to Admission medications   Medication Sig Start Date End Date Taking? Authorizing Provider  acetaminophen (TYLENOL) 650 MG CR tablet Take 650 mg by mouth every 8 (eight) hours as needed for pain.    Historical Provider, MD  LORazepam (ATIVAN) 0.5 MG tablet Take 0.5 mg by mouth 2 (two) times daily as needed. 11/30/14   Historical Provider, MD  multivitamin-iron-minerals-folic acid (CENTRUM) chewable tablet Chew 1 tablet by mouth daily.    Historical Provider, MD  Pseudoeph-Doxylamine-DM-APAP (NYQUIL MULTI-SYMPTOM PO) Take by mouth as needed.    Historical Provider, MD      Family History  Problem Relation Age of Onset  . Stroke Father   . Cancer Sister   . Cancer Brother     Social History   Social History  . Marital Status: Married    Spouse Name: N/A  . Number of Children: N/A  . Years of Education: N/A   Social History Main Topics  . Smoking status: Former Smoker -- 0.50 packs/day for 10 years    Quit date: 11/27/1972  . Smokeless tobacco: Never Used  . Alcohol Use: No  . Drug Use: No  . Sexual Activity: Not Asked   Other Topics Concern  . None   Social History Narrative    Review of Systems: A 12 point ROS discussed and pertinent positives are indicated in the HPI above.  All other systems are negative.  Review of Systems  Constitutional: Positive for activity change and fatigue.  Respiratory: Positive for cough and shortness of breath.   Musculoskeletal: Negative for back pain.  Neurological: Positive for weakness.  Psychiatric/Behavioral: Negative for behavioral problems and confusion.    Vital Signs: BP 123/67 mmHg  Pulse 129  Temp(Src) 98 F (36.7 C) (Oral)  Resp 18  SpO2 97%  Physical Exam  Constitutional: She is oriented to person, place, and time.  Cardiovascular: Normal rate.   No murmur heard. Pulmonary/Chest: Effort normal and breath sounds normal. She has no wheezes.  Abdominal: Soft. Bowel sounds are normal. There is no tenderness.  Musculoskeletal: Normal range of motion.  Neurological: She is alert and oriented to person, place, and time.  Skin: Skin is warm and dry.  Psychiatric: She has a normal mood and  affect. Her behavior is normal. Judgment and thought content normal.  Nursing note and vitals reviewed.   Mallampati Score:  MD Evaluation Airway: WNL Heart: WNL Abdomen: WNL Chest/ Lungs: WNL ASA  Classification: 3 Mallampati/Airway Score: Two  Imaging: Mr Kizzie Fantasia Contrast  12/11/2014   CLINICAL DATA:  Suspected lung cancer. Staging for brain metastases.  EXAM: MRI HEAD WITHOUT AND WITH  CONTRAST  TECHNIQUE: Multiplanar, multiecho pulse sequences of the brain and surrounding structures were obtained without and with intravenous contrast.  CONTRAST:  65m MULTIHANCE GADOBENATE DIMEGLUMINE 529 MG/ML IV SOLN  COMPARISON:  None.  FINDINGS: No evidence for acute infarction, hemorrhage, mass lesion, hydrocephalus, or extra-axial fluid. Moderate cerebral and cerebellar atrophy. Mild subcortical and periventricular T2 and FLAIR hyperintensities, likely chronic microvascular ischemic change. Flow voids are maintained throughout the carotid, basilar, and vertebral arteries. There are no areas of chronic hemorrhage. Pineal and cerebellar tonsils unremarkable. Partial empty sella. Mild cervical spondylosis.  Post infusion, no abnormal enhancement of the brain or meninges. Visualized calvarium, skull base, and upper cervical osseous structures unremarkable. Scalp and extracranial soft tissues, orbits, sinuses, and mastoids show no acute process.  Visualized calvarium, skull base, and upper cervical osseous structures unremarkable. Scalp and extracranial soft tissues, orbits, sinuses, and mastoids show no acute process.  IMPRESSION: Atrophy and small vessel disease.  No acute intracranial findings.  No metastatic disease to the brain or its coverings are evident.   Electronically Signed   By: JStaci RighterM.D.   On: 12/11/2014 14:05   Nm Pet Image Initial (pi) Skull Base To Thigh  12/21/2014   CLINICAL DATA:  Initial treatment strategy for left upper lobe lung carcinoma.  EXAM: NUCLEAR MEDICINE PET SKULL BASE TO THIGH  TECHNIQUE: 7.8 mCi F-18 FDG was injected intravenously. Full-ring PET imaging was performed from the skull base to thigh after the radiotracer. CT data was obtained and used for attenuation correction and anatomic localization.  FASTING BLOOD GLUCOSE:  Value: 107 mg/dl  COMPARISON:  Chest CT on 06/19/2014  FINDINGS: NECK  No hypermetabolic lymph nodes in the neck.  CHEST  A large hypermetabolic  central left upper lobe mass is seen which measures 9.6 cm and shows central necrosis. This has an SUV max of 20.3. This mass also appears to show invasion of the mediastinum, left hilum, and possibly the left anterior chest wall.  Mild hypermetabolic lymphadenopathy is seen in the prevascular and right paratracheal spaces. Largest right paratracheal lymph node measuring 1.2 cm, with SUV max of 3.7. Mild hypermetabolic lymphadenopathy also seen in both hilar regions.  ABDOMEN/PELVIS  No abnormal hypermetabolic activity within the liver, pancreas, adrenal glands, or spleen. No hypermetabolic lymph nodes in the abdomen or pelvis.  Moderate right hydronephrosis and mild right renal parenchymal atrophy is seen. No evidence of ureteral dilatation or calculi. This may be due to a chronic low-grade UPJ obstruction. A moderate to large left inguinal hernia is also seen containing descending and sigmoid colon. No evidence of bowel obstruction or ischemia colonic diverticulosis also noted, however there is no evidence of diverticulitis.  SKELETON  No focal hypermetabolic activity to suggest skeletal metastasis.  IMPRESSION: 9.6 cm hypermetabolic mass with central necrosis in the central left upper lobe, consistent with primary bronchogenic carcinoma. This mass shows mediastinal invasion, left hilar involvement, and possible left anterior chest wall invasion.  Mild hypermetabolic mediastinal and bilateral hilar lymphadenopathy, consistent with metastatic disease.  No evidence of metastatic disease within the neck, abdomen, or pelvis.  Moderate  right hydronephrosis and mild renal parenchymal atrophy. No etiology visualized, which raises suspicion for chronic low-grade UPJ obstruction.  Moderate to large left inguinal hernia containing colon.   Electronically Signed   By: Earle Gell M.D.   On: 12/21/2014 13:20   Dg Chest Port 1 View  12/04/2014   CLINICAL DATA:  Status post bronchoscopy, known left upper lobe mass  EXAM:  PORTABLE CHEST - 1 VIEW  COMPARISON:  CT scan of the chest of June 19, 2014 and chest x-ray of June 13, 2014  FINDINGS: There is persistent left upper lobe mass abutting the mediastinum with displacement of the trachea toward the right. There is no postprocedure pneumothorax or hemo thorax. There is density at the right lung base which corresponds to a posterior diaphragmatic hernia on the right with a large amount of mesenteric fat herniating into the right pleural space. The cardiac silhouette is mildly enlarged. The pulmonary vascularity is not engorged. The bony thorax exhibits no acute abnormality.  IMPRESSION: No postprocedure complication following bronchoscopy. There is an persistent large left upper lobe mass abutting the mediastinum.   Electronically Signed   By: David  Martinique M.D.   On: 12/04/2014 11:42   Dg C-arm Bronchoscopy  12/04/2014   CLINICAL DATA:    C-ARM BRONCHOSCOPY  Fluoroscopy was utilized by the requesting physician.  No radiographic  interpretation.     Labs:  CBC:  Recent Labs  11/28/14 1349 12/24/14 1450  WBC 10.6* 9.7  HGB 10.6* 10.6*  HCT 33.3* 33.5*  PLT 451* 408*    COAGS: No results for input(s): INR, APTT in the last 8760 hours.  BMP:  Recent Labs  11/28/14 1349 12/24/14 1450  NA 136 137  K 4.7 4.8  CO2 28 29  GLUCOSE 88 95  BUN 14.8 13.0  CALCIUM 9.2 9.2  CREATININE 0.7 0.7    LIVER FUNCTION TESTS:  Recent Labs  11/28/14 1349 12/24/14 1450  BILITOT 0.25 0.24  AST 11 10  ALT 7 11  ALKPHOS 114 116  PROT 7.4 7.3  ALBUMIN 3.1* 3.1*    TUMOR MARKERS: No results for input(s): AFPTM, CEA, CA199, CHROMGRNA in the last 8760 hours.  Assessment and Plan:  LUL mass Bronch neg 11/2014 +PET 12/2014 Now scheduled for bx Risks and Benefits discussed with the patient including, but not limited to bleeding, infection, damage to adjacent structures or low yield requiring additional tests. All of the patient's questions were answered,  patient is agreeable to proceed. Consent signed and in chart.    Thank you for this interesting consult.  I greatly enjoyed meeting Lindsey Fleming and look forward to participating in their care.  A copy of this report was sent to the requesting provider on this date.  Signed: Zella Dewan A 01/01/2015, 9:32 AM   I spent a total of  30 Minutes   in face to face in clinical consultation, greater than 50% of which was counseling/coordinating care for L lung mass bx

## 2015-01-08 ENCOUNTER — Ambulatory Visit (HOSPITAL_BASED_OUTPATIENT_CLINIC_OR_DEPARTMENT_OTHER): Payer: Medicare Other | Admitting: Internal Medicine

## 2015-01-08 ENCOUNTER — Encounter: Payer: Self-pay | Admitting: Internal Medicine

## 2015-01-08 ENCOUNTER — Telehealth: Payer: Self-pay | Admitting: Internal Medicine

## 2015-01-08 VITALS — BP 141/79 | HR 130 | Temp 98.7°F | Resp 18 | Ht 65.0 in | Wt 156.1 lb

## 2015-01-08 DIAGNOSIS — R599 Enlarged lymph nodes, unspecified: Secondary | ICD-10-CM | POA: Diagnosis not present

## 2015-01-08 DIAGNOSIS — R918 Other nonspecific abnormal finding of lung field: Secondary | ICD-10-CM

## 2015-01-08 DIAGNOSIS — R590 Localized enlarged lymph nodes: Secondary | ICD-10-CM

## 2015-01-08 DIAGNOSIS — J984 Other disorders of lung: Secondary | ICD-10-CM

## 2015-01-08 NOTE — Telephone Encounter (Signed)
Pt confirmed labs/ov per 08/23 POF, gave pt avs and calendar... KJ

## 2015-01-08 NOTE — Progress Notes (Signed)
[place] Telephone:([phone]   Fax:([phone]) [phone]  OFFICE PROGRESS NOTE  Simona Huh, MD [address] [place] [address]  DIAGNOSIS: Highly suspicious of stage III lung cancer, pending tissue diagnosis  PRIOR THERAPY: None  CURRENT THERAPY: None  INTERVAL HISTORY: Lindsey Fleming 81 y.o. female returns to the clinic today for follow-up visit accompanied by her daughter. The patient is feeling fine today with no specific complaints except for mild cough but no hemoptysis as well as occasional left-sided chest pain. She has mild shortness breath with exertion. The patient has no significant weight loss or night sweats. She has no fever or chills. She has no nausea or vomiting. She had CT-guided biopsy of the left upper lobe lung mass by interventional radiology recently and she is here for evaluation and discussion of her biopsy results and recommendation regarding her condition.  MEDICAL HISTORY: Past Medical History  Diagnosis Date  . Cataract   . H/O hernia repair     ALLERGIES:  has No Known Allergies.  MEDICATIONS:  Current Outpatient Prescriptions  Medication Sig Dispense Refill  . acetaminophen (TYLENOL) 650 MG CR tablet Take 650 mg by mouth every 8 (eight) hours as needed for pain.    Marland Kitchen LORazepam (ATIVAN) 0.5 MG tablet Take 0.5 mg by mouth 2 (two) times daily as needed for anxiety.   0  . Multiple Vitamins-Minerals (CENTRUM ADULTS PO) Take 1 each by mouth daily.    Marland Kitchen Phenyleph-Doxylamine-DM-APAP (NYQUIL SEVERE COLD/FLU) 5-6.25-10-325 MG/15ML LIQD Take 30 mLs by mouth at bedtime as needed (COUGH).     No current facility-administered medications for this visit.    SURGICAL HISTORY:  Past Surgical History  Procedure Laterality Date  . Abdominal hysterectomy    . Video bronchoscopy Bilateral 12/04/2014    Procedure: VIDEO BRONCHOSCOPY WITH FLUORO;  Surgeon: Rigoberto Noel, MD;  Location: [place];  Service:  Cardiopulmonary;  Laterality: Bilateral;    REVIEW OF SYSTEMS:  Constitutional: positive for fatigue Eyes: negative Ears, nose, mouth, throat, and face: negative Respiratory: positive for cough and dyspnea on exertion Cardiovascular: negative Gastrointestinal: negative Genitourinary:negative Integument/breast: negative Hematologic/lymphatic: negative Musculoskeletal:negative Neurological: negative Behavioral/Psych: negative Endocrine: negative Allergic/Immunologic: negative   PHYSICAL EXAMINATION: General appearance: alert, cooperative and no distress Head: Normocephalic, without obvious abnormality, atraumatic Neck: no adenopathy, no JVD, supple, symmetrical, trachea midline and thyroid not enlarged, symmetric, no tenderness/mass/nodules Lymph nodes: Cervical, supraclavicular, and axillary nodes normal. Resp: clear to auscultation bilaterally Back: symmetric, no curvature. ROM normal. No CVA tenderness. Cardio: regular rate and rhythm, S1, S2 normal, no murmur, click, rub or gallop GI: soft, non-tender; bowel sounds normal; no masses,  no organomegaly Extremities: extremities normal, atraumatic, no cyanosis or edema Neurologic: Alert and oriented X 3, normal strength and tone. Normal symmetric reflexes. Normal coordination and gait  ECOG PERFORMANCE STATUS: 1 - Symptomatic but completely ambulatory  Blood pressure 141/79, pulse 130, temperature 98.7 F (37.1 C), temperature source Oral, resp. rate 18, height '5\' 5"'$  (1.651 m), weight 156 lb 1.6 oz (70.806 kg), SpO2 97 %.  LABORATORY DATA: Lab Results  Component Value Date   WBC 10.5 01/01/2015   HGB 10.9* 01/01/2015   HCT 34.8* 01/01/2015   MCV 88.8 01/01/2015   PLT 474* 01/01/2015      Chemistry      Component Value Date/Time   NA 137 12/24/2014 1450   K 4.8 12/24/2014 1450   CO2 29 12/24/2014 1450   BUN 13.0  12/24/2014 1450   CREATININE 0.7 12/24/2014 1450      Component Value Date/Time   CALCIUM 9.2 12/24/2014  1450   ALKPHOS 116 12/24/2014 1450   AST 10 12/24/2014 1450   ALT 11 12/24/2014 1450   BILITOT 0.24 12/24/2014 1450       RADIOGRAPHIC STUDIES: Dg Chest 1 View  01/01/2015   CLINICAL DATA:  Status post left lung biopsy.  EXAM: CHEST  1 VIEW  COMPARISON:  Plain film of 12/04/2014 and the biopsy of earlier today. The PET of 12/21/2014.  FINDINGS: The trachea is displaced minimally to the right. Mild cardiomegaly. No pleural fluid. No pneumothorax. Left upper lobe lung mass, as before. Increased density projecting over the right lung base is secondary to a fat containing right-sided Bochdalek's hernia, when correlated with recent PET.  IMPRESSION: No pneumothorax or other acute finding.  Left upper lobe lung mass, as before.   Electronically Signed   By: Abigail Miyamoto M.D.   On: 01/01/2015 14:09   Mr Jeri Cos EG Contrast  12/11/2014   CLINICAL DATA:  Suspected lung cancer. Staging for brain metastases.  EXAM: MRI HEAD WITHOUT AND WITH CONTRAST  TECHNIQUE: Multiplanar, multiecho pulse sequences of the brain and surrounding structures were obtained without and with intravenous contrast.  CONTRAST:  39m MULTIHANCE GADOBENATE DIMEGLUMINE 529 MG/ML IV SOLN  COMPARISON:  None.  FINDINGS: No evidence for acute infarction, hemorrhage, mass lesion, hydrocephalus, or extra-axial fluid. Moderate cerebral and cerebellar atrophy. Mild subcortical and periventricular T2 and FLAIR hyperintensities, likely chronic microvascular ischemic change. Flow voids are maintained throughout the carotid, basilar, and vertebral arteries. There are no areas of chronic hemorrhage. Pineal and cerebellar tonsils unremarkable. Partial empty sella. Mild cervical spondylosis.  Post infusion, no abnormal enhancement of the brain or meninges. Visualized calvarium, skull base, and upper cervical osseous structures unremarkable. Scalp and extracranial soft tissues, orbits, sinuses, and mastoids show no acute process.  Visualized calvarium,  skull base, and upper cervical osseous structures unremarkable. Scalp and extracranial soft tissues, orbits, sinuses, and mastoids show no acute process.  IMPRESSION: Atrophy and small vessel disease.  No acute intracranial findings.  No metastatic disease to the brain or its coverings are evident.   Electronically Signed   By: JStaci RighterM.D.   On: 12/11/2014 14:05   Nm Pet Image Initial (pi) Skull Base To Thigh  12/21/2014   CLINICAL DATA:  Initial treatment strategy for left upper lobe lung carcinoma.  EXAM: NUCLEAR MEDICINE PET SKULL BASE TO THIGH  TECHNIQUE: 7.8 mCi F-18 FDG was injected intravenously. Full-ring PET imaging was performed from the skull base to thigh after the radiotracer. CT data was obtained and used for attenuation correction and anatomic localization.  FASTING BLOOD GLUCOSE:  Value: 107 mg/dl  COMPARISON:  Chest CT on 06/19/2014  FINDINGS: NECK  No hypermetabolic lymph nodes in the neck.  CHEST  A large hypermetabolic central left upper lobe mass is seen which measures 9.6 cm and shows central necrosis. This has an SUV max of 20.3. This mass also appears to show invasion of the mediastinum, left hilum, and possibly the left anterior chest wall.  Mild hypermetabolic lymphadenopathy is seen in the prevascular and right paratracheal spaces. Largest right paratracheal lymph node measuring 1.2 cm, with SUV max of 3.7. Mild hypermetabolic lymphadenopathy also seen in both hilar regions.  ABDOMEN/PELVIS  No abnormal hypermetabolic activity within the liver, pancreas, adrenal glands, or spleen. No hypermetabolic lymph nodes in the abdomen or pelvis.  Moderate  right hydronephrosis and mild right renal parenchymal atrophy is seen. No evidence of ureteral dilatation or calculi. This may be due to a chronic low-grade UPJ obstruction. A moderate to large left inguinal hernia is also seen containing descending and sigmoid colon. No evidence of bowel obstruction or ischemia colonic diverticulosis also  noted, however there is no evidence of diverticulitis.  SKELETON  No focal hypermetabolic activity to suggest skeletal metastasis.  IMPRESSION: 9.6 cm hypermetabolic mass with central necrosis in the central left upper lobe, consistent with primary bronchogenic carcinoma. This mass shows mediastinal invasion, left hilar involvement, and possible left anterior chest wall invasion.  Mild hypermetabolic mediastinal and bilateral hilar lymphadenopathy, consistent with metastatic disease.  No evidence of metastatic disease within the neck, abdomen, or pelvis.  Moderate right hydronephrosis and mild renal parenchymal atrophy. No etiology visualized, which raises suspicion for chronic low-grade UPJ obstruction.  Moderate to large left inguinal hernia containing colon.   Electronically Signed   By: Earle Gell M.D.   On: 12/21/2014 13:20   Ct Biopsy  01/01/2015   CLINICAL DATA:  Left upper lobe lung mass with mediastinal invasion  EXAM: CT GUIDED CORE BIOPSY OF LEFT UPPER LOBE LUNG MASS  ANESTHESIA/SEDATION: Intravenous Fentanyl and Versed were administered as conscious sedation during continuous cardiorespiratory monitoring by the radiology RN, with a total moderate sedation time of four minutes.  PROCEDURE: The procedure risks, benefits, and alternatives were explained to the patient. Questions regarding the procedure were encouraged and answered. The patient understands and consents to the procedure.  Patient placed supine. Select axial scans through the thorax were obtained. The lesion was localized and an appropriate skin entry site was determined and marked.  The operative field was prepped with Betadinein a sterile fashion, and a sterile drape was applied covering the operative field. A sterile gown and sterile gloves were used for the procedure. Local anesthesia was provided with 1% Lidocaine.  Under CT fluoroscopic guidance, a 17 gauge trocar needle was advanced to the margin of the lesion. Once needle tip  position was confirmed, coaxial 18-gauge core biopsy samples were obtained, submitted in formalin to surgical pathology. The guide needle was removed. Postprocedure scans show no pneumothorax or significant regional alveolar hemorrhage.  COMPLICATIONS: None immediate  FINDINGS: Anterior left upper lobe mass with mediastinal invasion again localized. CT-guided core biopsy samples were obtained.  IMPRESSION: 1. Technically successful CT-guided core biopsy of left upper lobe lung mass.   Electronically Signed   By: Lucrezia Europe M.D.   On: 01/01/2015 13:20    ASSESSMENT AND PLAN: This is a very pleasant 81 years old white female with highly suspicious for stage III lung cancer. The patient has no confirm tissue diagnosis at this point. She underwent bronchoscopy by Dr. Elsworth Soho was not conclusive for malignancy. The recent MRI of the brain showed no evidence for metastatic disease to the brain and a PET scan showed a large 9.6 cm hypermetabolic mass with central necrosis in the central left upper lobe consistent with primary bronchogenic carcinoma with mediastinal invasion as well as left hilar involvement and possible left anterior chest wall invasion. There was also mild hypermetabolic mediastinal and bilateral hilar lymphadenopathy consistent with metastatic disease but no evidence for metastatic disease in the neck, abdomen or pelvis. The recent CT guided core biopsy of the left upper lobe lung mass in addition to the previous bronchoscopy and biopsy showed no malignant cells. Her imaging studies are highly suspicious for lung cancer but unfortunately we don't have  a tissue diagnosis. I discussed the results with the patient and her daughter. I will rediscuss her case at the weekly thoracic conference for further recommendation. I will see the patient back for follow-up visit in 6 weeks for reevaluation with repeat CT scan of the chest or sooner if there is any concerning findings or other recommendation from the  thoracic group. She was advised to call immediately if she has any concerning symptoms in the interval. The patient voices understanding of current disease status and treatment options and is in agreement with the current care plan.  All questions were answered. The patient knows to call the clinic with any problems, questions or concerns. We can certainly see the patient much sooner if necessary.  Disclaimer: This note was dictated with voice recognition software. Similar sounding words can inadvertently be transcribed and may not be corrected upon review.

## 2015-01-10 ENCOUNTER — Telehealth: Payer: Self-pay | Admitting: *Deleted

## 2015-01-10 NOTE — Telephone Encounter (Signed)
Oncology Nurse Navigator Documentation  Oncology Nurse Navigator Flowsheets 01/10/2015  Navigator Encounter Type Telephone/Patient's case was discussed at cancer conference today.  It was determined that patient needs another needle biopsy.  I called and spoke with the daughter, [name].  I explained cancer conference discussion.  [name] stated her mother does not want to do anything right now.  I asked that she call me when she is ready for biopsy.    Treatment Phase Abnormal Scans  Interventions Coordination of Care  Time Spent with Patient 30

## 2015-01-16 LAB — AFB CULTURE WITH SMEAR (NOT AT ARMC)
ACID FAST SMEAR: NONE SEEN
Special Requests: NORMAL

## 2015-01-21 ENCOUNTER — Encounter (HOSPITAL_COMMUNITY): Payer: Self-pay | Admitting: Vascular Surgery

## 2015-01-21 ENCOUNTER — Emergency Department (HOSPITAL_COMMUNITY): Payer: Medicare Other

## 2015-01-21 ENCOUNTER — Observation Stay (HOSPITAL_COMMUNITY)
Admission: EM | Admit: 2015-01-21 | Discharge: 2015-01-24 | Disposition: A | Discharge status: Home / Self Care | Payer: Medicare Other | Attending: Internal Medicine | Admitting: Internal Medicine

## 2015-01-21 DIAGNOSIS — R590 Localized enlarged lymph nodes: Secondary | ICD-10-CM | POA: Insufficient documentation

## 2015-01-21 DIAGNOSIS — Z79899 Other long term (current) drug therapy: Secondary | ICD-10-CM | POA: Diagnosis not present

## 2015-01-21 DIAGNOSIS — R739 Hyperglycemia, unspecified: Secondary | ICD-10-CM | POA: Insufficient documentation

## 2015-01-21 DIAGNOSIS — R079 Chest pain, unspecified: Secondary | ICD-10-CM | POA: Insufficient documentation

## 2015-01-21 DIAGNOSIS — R918 Other nonspecific abnormal finding of lung field: Secondary | ICD-10-CM | POA: Diagnosis not present

## 2015-01-21 DIAGNOSIS — D649 Anemia, unspecified: Secondary | ICD-10-CM | POA: Diagnosis not present

## 2015-01-21 DIAGNOSIS — I4891 Unspecified atrial fibrillation: Secondary | ICD-10-CM | POA: Insufficient documentation

## 2015-01-21 DIAGNOSIS — I491 Atrial premature depolarization: Secondary | ICD-10-CM | POA: Diagnosis not present

## 2015-01-21 DIAGNOSIS — R599 Enlarged lymph nodes, unspecified: Secondary | ICD-10-CM | POA: Diagnosis not present

## 2015-01-21 DIAGNOSIS — Z87891 Personal history of nicotine dependence: Secondary | ICD-10-CM | POA: Insufficient documentation

## 2015-01-21 DIAGNOSIS — J984 Other disorders of lung: Secondary | ICD-10-CM

## 2015-01-21 DIAGNOSIS — R042 Hemoptysis: Secondary | ICD-10-CM | POA: Diagnosis not present

## 2015-01-21 LAB — I-STAT CG4 LACTIC ACID, ED: LACTIC ACID, VENOUS: 1.55 mmol/L (ref 0.5–2.0)

## 2015-01-21 LAB — CBC
HCT: 34.1 % — ABNORMAL LOW (ref 36.0–46.0)
Hemoglobin: 10.9 g/dL — ABNORMAL LOW (ref 12.0–15.0)
MCH: 28.2 pg (ref 26.0–34.0)
MCHC: 32 g/dL (ref 30.0–36.0)
MCV: 88.3 fL (ref 78.0–100.0)
Platelets: 415 10*3/uL — ABNORMAL HIGH (ref 150–400)
RBC: 3.86 MIL/uL — ABNORMAL LOW (ref 3.87–5.11)
RDW: 15.7 % — AB (ref 11.5–15.5)
WBC: 10.2 10*3/uL (ref 4.0–10.5)

## 2015-01-21 LAB — BASIC METABOLIC PANEL
Anion gap: 8 (ref 5–15)
BUN: 14 mg/dL (ref 6–20)
CALCIUM: 8.9 mg/dL (ref 8.9–10.3)
CO2: 26 mmol/L (ref 22–32)
Chloride: 101 mmol/L (ref 101–111)
Creatinine, Ser: 0.77 mg/dL (ref 0.44–1.00)
GFR calc Af Amer: >60 mL/min (ref >60)
GLUCOSE: 113 mg/dL — AB (ref 65–99)
Potassium: 4.5 mmol/L (ref 3.5–5.1)
Sodium: 135 mmol/L (ref 135–145)

## 2015-01-21 LAB — I-STAT TROPONIN, ED: Troponin i, poc: 0 ng/mL (ref 0.00–0.08)

## 2015-01-21 MED ORDER — SODIUM CHLORIDE 0.9 % IV SOLN
1000.0000 mL | INTRAVENOUS | Status: DC
  Administered 2015-01-22 (×3): 1000 mL via INTRAVENOUS

## 2015-01-21 MED ORDER — SODIUM CHLORIDE 0.9 % IV SOLN
1000.0000 mL | Freq: Once | INTRAVENOUS | Status: AC
  Administered 2015-01-21: 1000 mL via INTRAVENOUS

## 2015-01-21 MED ORDER — SODIUM CHLORIDE 0.9 % IJ SOLN
3.0000 mL | Freq: Two times a day (BID) | INTRAMUSCULAR | Status: DC
  Administered 2015-01-22 – 2015-01-23 (×4): 3 mL via INTRAVENOUS

## 2015-01-21 MED ORDER — DILTIAZEM LOAD VIA INFUSION
10.0000 mg | Freq: Once | INTRAVENOUS | Status: AC
  Administered 2015-01-22: 10 mg via INTRAVENOUS
  Filled 2015-01-21: qty 10

## 2015-01-21 MED ORDER — DILTIAZEM HCL 100 MG IV SOLR
5.0000 mg/h | INTRAVENOUS | Status: DC
  Administered 2015-01-22: 5 mg/h via INTRAVENOUS
  Filled 2015-01-21: qty 100

## 2015-01-21 NOTE — H&P (Signed)
[facility] History and Physical  Lindsey Fleming RXV:[id number] DOB: 06/13/1927 DOA: 01/21/2015  Referring physician: EDP PCP: Simona Huh, MD   Chief Complaint: Hemoptysis   HPI: Lindsey Fleming is a 81 y.o. female with h/o known lung tumor which looks to be a stage 3 bronchogenic carcinoma with lymphnode involvement.  Inconclusive biopsy.  Patient presents to the ED with hemoptysis that onset at 5pm.  Frequent coughing productive of blood.  Has been similarly having problems with hemoptysis for several months.  Patient loosing weight and voice has been becoming hoarse.  EDP tried to get in touch with pulmonology on call but was unable to do so (see Dr. Colin Broach note).  Hospitalist admitting to try again in AM.  Review of Systems: Systems reviewed.  As above, otherwise negative  Past Medical History  Diagnosis Date  . Cataract   . H/O hernia repair    Past Surgical History  Procedure Laterality Date  . Abdominal hysterectomy    . Video bronchoscopy Bilateral 12/04/2014    Procedure: VIDEO BRONCHOSCOPY WITH FLUORO;  Surgeon: Rigoberto Noel, MD;  Location: [facility];  Service: Cardiopulmonary;  Laterality: Bilateral;   Social History:  reports that she quit smoking about 42 years ago. She has never used smokeless tobacco. She reports that she does not drink alcohol or use illicit drugs.  No Known Allergies  Family History  Problem Relation Age of Onset  . Stroke Father   . Cancer Sister   . Cancer Brother      Prior to Admission medications   Medication Sig Start Date End Date Taking? Authorizing Provider  acetaminophen (TYLENOL) 650 MG CR tablet Take 650 mg by mouth every 8 (eight) hours as needed for pain.   Yes Historical Provider, MD  LORazepam (ATIVAN) 0.5 MG tablet Take 0.5 mg by mouth 2 (two) times daily as needed for anxiety.  11/30/14  Yes Historical Provider, MD  Multiple Vitamins-Minerals (CENTRUM ADULTS PO) Take 1 each by mouth daily.   Yes  Historical Provider, MD  Phenyleph-Doxylamine-DM-APAP (NYQUIL SEVERE COLD/FLU) 5-6.25-10-325 MG/15ML LIQD Take 30 mLs by mouth at bedtime as needed (COUGH).   Yes Historical Provider, MD   Physical Exam: Filed Vitals:   01/21/15 2330  BP: 103/65  Pulse: 133  Temp:   Resp: 24    BP 103/65 mmHg  Pulse 133  Temp(Src) 98.7 F (37.1 C) (Oral)  Resp 24  SpO2 95%  General Appearance:    Alert, oriented, no distress, appears stated age  Head:    Normocephalic, atraumatic  Eyes:    PERRL, EOMI, sclera non-icteric        Nose:   Nares without drainage or epistaxis. Mucosa, turbinates normal  Throat:   Moist mucous membranes. Oropharynx without erythema or exudate.  Neck:   Supple. No carotid bruits.  No thyromegaly.  No lymphadenopathy.   Back:     No CVA tenderness, no spinal tenderness  Lungs:     Clear to auscultation bilaterally, without wheezes, rhonchi or rales  Chest wall:    No tenderness to palpitation  Heart:    Regular rate and rhythm without murmurs, gallops, rubs  Abdomen:     Soft, non-tender, nondistended, normal bowel sounds, no organomegaly  Genitalia:    deferred  Rectal:    deferred  Extremities:   No clubbing, cyanosis or edema.  Pulses:   2+ and symmetric all extremities  Skin:   Skin color, texture, turgor normal, no rashes or lesions  Lymph  nodes:   Cervical, supraclavicular, and axillary nodes normal  Neurologic:   CNII-XII intact. Normal strength, sensation and reflexes      throughout    Labs on Admission:  Basic Metabolic Panel:  Recent Labs Lab 01/21/15 2019  NA 135  K 4.5  CL 101  CO2 26  GLUCOSE 113*  BUN 14  CREATININE 0.77  CALCIUM 8.9   Liver Function Tests: No results for input(s): AST, ALT, ALKPHOS, BILITOT, PROT, ALBUMIN in the last 168 hours. No results for input(s): LIPASE, AMYLASE in the last 168 hours. No results for input(s): AMMONIA in the last 168 hours. CBC:  Recent Labs Lab 01/21/15 2019  WBC 10.2  HGB 10.9*  HCT  34.1*  MCV 88.3  PLT 415*   Cardiac Enzymes: No results for input(s): CKTOTAL, CKMB, CKMBINDEX, TROPONINI in the last 168 hours.  BNP (last 3 results) No results for input(s): PROBNP in the last 8760 hours. CBG: No results for input(s): GLUCAP in the last 168 hours.  Radiological Exams on Admission: Dg Chest 2 View  01/21/2015   CLINICAL DATA:  Productive cough for 3 days.  Coughing up blood.  EXAM: CHEST  2 VIEW  COMPARISON:  01/01/2015  FINDINGS: Mass lesion in the left upper lung/ suprahilar region medially, measuring 10.9 by 5.4 cm. This likely extends into the mediastinum as the trachea is displaced towards the left. Mild prominence of the right paratracheal region likely representing lymphadenopathy. Appearance is similar to prior study. There is infiltration or consolidation in the right lung base, similar to prior study. No new parenchymal changes. No blunting of costophrenic angles. Elevation of the right posterior hemidiaphragm. No pneumothorax. Degenerative changes in the spine.  IMPRESSION: Left upper lung mass and probable mediastinal lymphadenopathy. Atelectasis or consolidation in the right lung base. No change since previous study.   Electronically Signed   By: Lucienne Capers M.D.   On: 01/21/2015 21:31    EKG: Independently reviewed.  Assessment/Plan Principal Problem:   Hemoptysis Active Problems:   Cavitating mass in left upper lung lobe   Mediastinal lymphadenopathy   Atrial fibrillation with RVR   1. Hemoptysis - See EDPs note: EDP tried to get in touch with pulmonology on call but was unable to do so (see Dr. Colin Broach note). 1. Hospitalist admitting for pulm consult in AM. 2. Not massive or severe 3. Recheck CBC in AM 4. Does not appear to be adversely affecting pulmonary function at this point. 2. A.Fib RVR - asymptomatic but appears to have been ongoing for some time with numerous office visits with HRs in the mid 100s. 1. Cardizem gtt for rate control 2. No  anticoagulation due to active hemoptysis    Code Status: Full  Family Communication: Family at bedside Disposition Plan: Admit to obs   Time spent: 50 min  Dashton Czerwinski M. [facility] Pager [phone]  If 7AM-7PM, please contact the day team taking care of the patient [email or web address] Password [email or web address] 01/21/2015, 11:57 PM

## 2015-01-21 NOTE — ED Notes (Signed)
Receiving floor does not receive cardizem drips. Bed changed to step down

## 2015-01-21 NOTE — ED Notes (Signed)
Pt reports to the ED for eval of hemoptysis. Pt had a biopsy done 3 weeks ago and yesterday she started coughing up bright red blood. Pt is not on blood thinners. Pt is tachycardic in the 140s but daughter reports she is always tachycardic. Denies any CP or SOB. Pt A&Ox4, resp e/u, and skin warm and dry.

## 2015-01-21 NOTE — ED Notes (Signed)
Attempted to call report

## 2015-01-21 NOTE — ED Provider Notes (Signed)
CSN: [id number]     Arrival date & time 01/21/15  1955 History   First MD Initiated Contact with Patient 01/21/15 2048     Chief Complaint  Patient presents with  . Hemoptysis     (Consider location/radiation/quality/duration/timing/severity/associated sxs/prior Treatment) The history is provided by the patient and a relative.   81 year old female comes in because of hemoptysis which started at 5 PM. She is bringing up blood every time she coughs, which is frequently. Blood is bright red and sometimes mixed with sputum and sometimes just blood. She denies chest pain and denies dyspnea. She has a known tumor in the lung and has been having problems with hemoptysis for several months. Family relates that she has been losing weight and her voice is becoming worse. Patient denies both of these.  Past Medical History  Diagnosis Date  . Cataract   . H/O hernia repair    Past Surgical History  Procedure Laterality Date  . Abdominal hysterectomy    . Video bronchoscopy Bilateral 12/04/2014    Procedure: VIDEO BRONCHOSCOPY WITH FLUORO;  Surgeon: Rigoberto Noel, MD;  Location: [place];  Service: Cardiopulmonary;  Laterality: Bilateral;   Family History  Problem Relation Age of Onset  . Stroke Father   . Cancer Sister   . Cancer Brother    Social History  Substance Use Topics  . Smoking status: Former Smoker -- 0.50 packs/day for 10 years    Quit date: 11/27/1972  . Smokeless tobacco: Never Used  . Alcohol Use: No   OB History    No data available     Review of Systems  All other systems reviewed and are negative.     Allergies  Review of patient's allergies indicates no known allergies.  Home Medications   Prior to Admission medications   Medication Sig Start Date End Date Taking? Authorizing Provider  acetaminophen (TYLENOL) 650 MG CR tablet Take 650 mg by mouth every 8 (eight) hours as needed for pain.    Historical Provider, MD  LORazepam (ATIVAN) 0.5 MG tablet Take  0.5 mg by mouth 2 (two) times daily as needed for anxiety.  11/30/14   Historical Provider, MD  Multiple Vitamins-Minerals (CENTRUM ADULTS PO) Take 1 each by mouth daily.    Historical Provider, MD  Phenyleph-Doxylamine-DM-APAP (NYQUIL SEVERE COLD/FLU) 5-6.25-10-325 MG/15ML LIQD Take 30 mLs by mouth at bedtime as needed (COUGH).    Historical Provider, MD   BP 140/83 mmHg  Pulse 142  Temp(Src) 98.7 F (37.1 C) (Oral)  Resp 18  SpO2 95% Physical Exam  Nursing note and vitals reviewed.  81 year old female, resting comfortably and in no acute distress. Vital signs are significant for tachycardia. Oxygen saturation is 95%, which is normal. Head is normocephalic and atraumatic. PERRLA, EOMI. Oropharynx is clear. Neck is nontender and supple without adenopathy or JVD. Back is nontender and there is no CVA tenderness. Lungs are clear without rales, wheezes, or rhonchi. Chest is nontender. Heart has regular rate and rhythm without murmur. Abdomen is soft, flat, nontender without masses or hepatosplenomegaly and peristalsis is normoactive. Extremities have no cyanosis or edema, full range of motion is present. Skin is warm and dry without rash. Neurologic: Mental status is normal, cranial nerves are intact, there are no motor or sensory deficits. Voice is noted to be hoarse.  ED Course  Procedures (including critical care time) Labs Review Results for orders placed or performed during the hospital encounter of 62/95/28  Basic metabolic panel  Result  Value Ref Range   Sodium 135 135 - 145 mmol/L   Potassium 4.5 3.5 - 5.1 mmol/L   Chloride 101 101 - 111 mmol/L   CO2 26 22 - 32 mmol/L   Glucose, Bld 113 (H) 65 - 99 mg/dL   BUN 14 6 - 20 mg/dL   Creatinine, Ser 0.77 0.44 - 1.00 mg/dL   Calcium 8.9 8.9 - 10.3 mg/dL   GFR calc non Af Amer >60 >60 mL/min   GFR calc Af Amer >60 >60 mL/min   Anion gap 8 5 - 15  CBC  Result Value Ref Range   WBC 10.2 4.0 - 10.5 K/uL   RBC 3.86 (L) 3.87 -  5.11 MIL/uL   Hemoglobin 10.9 (L) 12.0 - 15.0 g/dL   HCT 34.1 (L) 36.0 - 46.0 %   MCV 88.3 78.0 - 100.0 fL   MCH 28.2 26.0 - 34.0 pg   MCHC 32.0 30.0 - 36.0 g/dL   RDW 15.7 (H) 11.5 - 15.5 %   Platelets 415 (H) 150 - 400 K/uL  I-stat troponin, ED  Result Value Ref Range   Troponin i, poc 0.00 0.00 - 0.08 ng/mL   Comment 3          I-Stat CG4 Lactic Acid, ED  Result Value Ref Range   Lactic Acid, Venous 1.55 0.5 - 2.0 mmol/L   Imaging Review Dg Chest 2 View  01/21/2015   CLINICAL DATA:  Productive cough for 3 days.  Coughing up blood.  EXAM: CHEST  2 VIEW  COMPARISON:  01/01/2015  FINDINGS: Mass lesion in the left upper lung/ suprahilar region medially, measuring 10.9 by 5.4 cm. This likely extends into the mediastinum as the trachea is displaced towards the left. Mild prominence of the right paratracheal region likely representing lymphadenopathy. Appearance is similar to prior study. There is infiltration or consolidation in the right lung base, similar to prior study. No new parenchymal changes. No blunting of costophrenic angles. Elevation of the right posterior hemidiaphragm. No pneumothorax. Degenerative changes in the spine.  IMPRESSION: Left upper lung mass and probable mediastinal lymphadenopathy. Atelectasis or consolidation in the right lung base. No change since previous study.   Electronically Signed   By: Lucienne Capers M.D.   On: 01/21/2015 21:31   I have personally reviewed and evaluated these images and lab results as part of my medical decision-making.   EKG Interpretation   Date/Time:  Monday January 21 2015 20:11:03 EDT Ventricular Rate:  142 PR Interval:  176 QRS Duration: 74 QT Interval:  270 QTC Calculation: 415 R Axis:   -83 Text Interpretation:  Sinus tachycardia Left anterior fascicular block  Septal infarct , age undetermined Abnormal ECG No old tracing to compare  Confirmed by [facility]  MD, Chadd Tollison ([id number]) on 01/21/2015 8:49:52 PM      MDM   Final  diagnoses:  Hemoptysis  Cavitating mass in left upper lung lobe    Worsening hemoptysis in patient with known lung tumor. Old records are reviewed and she actually started with hemoptysis in January and chest x-ray did show a mass. She has had a CT scan which shows that it is cavitating. PET scan showed hypermetabolic mass with extension to the mediastinum. She had a bronchoscopy with biopsy but no malignant cells were found. Oncology his delaying treatment until definite tissue diagnosis is made. She has been tachycardic over the last month with almost every heart rate recorded being over 110. Hemoglobin has not dropped compared with baseline. Will  discuss case with pulmonology.  I have been unable to reach pulmonology despite paging twice myself and putting in a formal consult request. Patient has continued to cough up small amounts of blood pH times she coughs. At this point, I feel that the best course would be to admit her under observation status for pulmonology consult in the morning. Case is discussed with Dr. Alcario Drought of [facility] who agrees to admit the patient under observation status.  Delora Fuel, MD 50/[id number]

## 2015-01-22 ENCOUNTER — Observation Stay (HOSPITAL_COMMUNITY): Payer: Medicare Other

## 2015-01-22 ENCOUNTER — Encounter (HOSPITAL_COMMUNITY): Payer: Self-pay | Admitting: *Deleted

## 2015-01-22 DIAGNOSIS — R042 Hemoptysis: Secondary | ICD-10-CM | POA: Diagnosis not present

## 2015-01-22 DIAGNOSIS — I471 Supraventricular tachycardia: Secondary | ICD-10-CM

## 2015-01-22 DIAGNOSIS — R599 Enlarged lymph nodes, unspecified: Secondary | ICD-10-CM | POA: Diagnosis not present

## 2015-01-22 DIAGNOSIS — R918 Other nonspecific abnormal finding of lung field: Secondary | ICD-10-CM | POA: Diagnosis not present

## 2015-01-22 LAB — BASIC METABOLIC PANEL
ANION GAP: 7 (ref 5–15)
BUN: 11 mg/dL (ref 6–20)
CALCIUM: 8.5 mg/dL — AB (ref 8.9–10.3)
CHLORIDE: 105 mmol/L (ref 101–111)
CO2: 26 mmol/L (ref 22–32)
CREATININE: 0.62 mg/dL (ref 0.44–1.00)
GFR calc Af Amer: >60 mL/min (ref >60)
GFR calc non Af Amer: >60 mL/min (ref >60)
GLUCOSE: 134 mg/dL — AB (ref 65–99)
Potassium: 4.2 mmol/L (ref 3.5–5.1)
Sodium: 138 mmol/L (ref 135–145)

## 2015-01-22 LAB — CBC
HEMATOCRIT: 32.1 % — AB (ref 36.0–46.0)
HEMATOCRIT: 33.9 % — AB (ref 36.0–46.0)
HEMOGLOBIN: 10 g/dL — AB (ref 12.0–15.0)
HEMOGLOBIN: 11 g/dL — AB (ref 12.0–15.0)
MCH: 27.7 pg (ref 26.0–34.0)
MCH: 29 pg (ref 26.0–34.0)
MCHC: 31.2 g/dL (ref 30.0–36.0)
MCHC: 32.4 g/dL (ref 30.0–36.0)
MCV: 88.9 fL (ref 78.0–100.0)
MCV: 89.4 fL (ref 78.0–100.0)
Platelets: 391 10*3/uL (ref 150–400)
Platelets: 402 10*3/uL — ABNORMAL HIGH (ref 150–400)
RBC: 3.61 MIL/uL — ABNORMAL LOW (ref 3.87–5.11)
RBC: 3.79 MIL/uL — ABNORMAL LOW (ref 3.87–5.11)
RDW: 15.8 % — AB (ref 11.5–15.5)
RDW: 15.8 % — AB (ref 11.5–15.5)
WBC: 9.4 10*3/uL (ref 4.0–10.5)
WBC: 10.6 10*3/uL — ABNORMAL HIGH (ref 4.0–10.5)

## 2015-01-22 LAB — PROTIME-INR
INR: 1.13 (ref 0.00–1.49)
Prothrombin Time: 14.7 seconds (ref 11.6–15.2)

## 2015-01-22 LAB — APTT: aPTT: 30 seconds (ref 24–37)

## 2015-01-22 LAB — MRSA PCR SCREENING: MRSA BY PCR: NEGATIVE

## 2015-01-22 MED ORDER — ACETAMINOPHEN 325 MG PO TABS
650.0000 mg | ORAL_TABLET | Freq: Four times a day (QID) | ORAL | Status: DC | PRN
  Administered 2015-01-22: 650 mg via ORAL
  Filled 2015-01-22: qty 2

## 2015-01-22 MED ORDER — IOHEXOL 350 MG/ML SOLN
100.0000 mL | Freq: Once | INTRAVENOUS | Status: AC | PRN
  Administered 2015-01-22: 100 mL via INTRAVENOUS

## 2015-01-22 MED ORDER — LORAZEPAM 0.5 MG PO TABS
0.5000 mg | ORAL_TABLET | Freq: Two times a day (BID) | ORAL | Status: DC | PRN
  Administered 2015-01-23 (×2): 0.5 mg via ORAL
  Filled 2015-01-22 (×3): qty 1

## 2015-01-22 NOTE — Consult Note (Signed)
Chief Complaint: Patient was seen in consultation today for  Chief Complaint  Patient presents with  . Hemoptysis   at the request of * No referring provider recorded for this case *  Referring Physician(s): Ramaswami, MD  History of Present Illness: Lindsey Fleming is a 81 y.o. female who developed the acute onset of hemoptysis yesterday at 5 PM. She coughed one cup, total until this morning and minimal since then. She was admitted from the ED last night. She is not SOB and and is not on oxygen. She has a known RUL mass. So far biopsy and bronch have been negative. These were performed previously. She has not had a bronchoscopy during this admission yet. She also has developed atrial fibrillation and is on a Cardizem drip, with a HR of 100, and presently NSR. She denies chest pain.  Past Medical History  Diagnosis Date  . Cataract     bilateral  . H/O hernia repair     Past Surgical History  Procedure Laterality Date  . Abdominal hysterectomy    . Video bronchoscopy Bilateral 12/04/2014    Procedure: VIDEO BRONCHOSCOPY WITH FLUORO;  Surgeon: Rigoberto Noel, MD;  Location: [facility];  Service: Cardiopulmonary;  Laterality: Bilateral;  . Cataract extraction Bilateral   . Bladder suspension    . Hernia repair      Allergies: Review of patient's allergies indicates no known allergies.  Medications: Prior to Admission medications   Medication Sig Start Date End Date Taking? Authorizing Provider  acetaminophen (TYLENOL) 650 MG CR tablet Take 650 mg by mouth every 8 (eight) hours as needed for pain.   Yes Historical Provider, MD  LORazepam (ATIVAN) 0.5 MG tablet Take 0.5 mg by mouth 2 (two) times daily as needed for anxiety.  11/30/14  Yes Historical Provider, MD  Multiple Vitamins-Minerals (CENTRUM ADULTS PO) Take 1 each by mouth daily.   Yes Historical Provider, MD  Phenyleph-Doxylamine-DM-APAP (NYQUIL SEVERE COLD/FLU) 5-6.25-10-325 MG/15ML LIQD Take 30 mLs by mouth at  bedtime as needed (COUGH).   Yes Historical Provider, MD     Family History  Problem Relation Age of Onset  . Stroke Father   . Cancer Sister   . Cancer Brother     Social History   Social History  . Marital Status: Married    Spouse Name: N/A  . Number of Children: N/A  . Years of Education: N/A   Social History Main Topics  . Smoking status: Former Smoker -- 0.50 packs/day for 10 years    Quit date: 11/27/1972  . Smokeless tobacco: Never Used  . Alcohol Use: No  . Drug Use: No  . Sexual Activity: Not Asked   Other Topics Concern  . None   Social History Narrative    ECOG Status: 3 - Symptomatic, >50% confined to bed  Review of Systems: A 12 point ROS discussed and pertinent positives are indicated in the HPI above.  All other systems are negative.  Review of Systems  Vital Signs: BP 138/73 mmHg  Pulse 112  Temp(Src) 98.3 F (36.8 C) (Oral)  Resp 23  Ht '5\' 5"'$  (1.651 m)  Wt 156 lb 15.5 oz (71.2 kg)  BMI 26.12 kg/m2  SpO2 94%  Physical Exam  Constitutional: She is oriented to person, place, and time. She appears well-developed and well-nourished.  Cardiovascular: Normal rate and regular rhythm.   Pulmonary/Chest: Effort normal.  Neurological: She is alert and oriented to person, place, and time.  Mallampati Score:     Imaging: Dg Chest 1 View  01/01/2015   CLINICAL DATA:  Status post left lung biopsy.  EXAM: CHEST  1 VIEW  COMPARISON:  Plain film of 12/04/2014 and the biopsy of earlier today. The PET of 12/21/2014.  FINDINGS: The trachea is displaced minimally to the right. Mild cardiomegaly. No pleural fluid. No pneumothorax. Left upper lobe lung mass, as before. Increased density projecting over the right lung base is secondary to a fat containing right-sided Bochdalek's hernia, when correlated with recent PET.  IMPRESSION: No pneumothorax or other acute finding.  Left upper lobe lung mass, as before.   Electronically Signed   By: Abigail Miyamoto M.D.    On: 01/01/2015 14:09   Dg Chest 2 View  01/21/2015   CLINICAL DATA:  Productive cough for 3 days.  Coughing up blood.  EXAM: CHEST  2 VIEW  COMPARISON:  01/01/2015  FINDINGS: Mass lesion in the left upper lung/ suprahilar region medially, measuring 10.9 by 5.4 cm. This likely extends into the mediastinum as the trachea is displaced towards the left. Mild prominence of the right paratracheal region likely representing lymphadenopathy. Appearance is similar to prior study. There is infiltration or consolidation in the right lung base, similar to prior study. No new parenchymal changes. No blunting of costophrenic angles. Elevation of the right posterior hemidiaphragm. No pneumothorax. Degenerative changes in the spine.  IMPRESSION: Left upper lung mass and probable mediastinal lymphadenopathy. Atelectasis or consolidation in the right lung base. No change since previous study.   Electronically Signed   By: Lucienne Capers M.D.   On: 01/21/2015 21:31   Ct Biopsy  01/01/2015   CLINICAL DATA:  Left upper lobe lung mass with mediastinal invasion  EXAM: CT GUIDED CORE BIOPSY OF LEFT UPPER LOBE LUNG MASS  ANESTHESIA/SEDATION: Intravenous Fentanyl and Versed were administered as conscious sedation during continuous cardiorespiratory monitoring by the radiology RN, with a total moderate sedation time of four minutes.  PROCEDURE: The procedure risks, benefits, and alternatives were explained to the patient. Questions regarding the procedure were encouraged and answered. The patient understands and consents to the procedure.  Patient placed supine. Select axial scans through the thorax were obtained. The lesion was localized and an appropriate skin entry site was determined and marked.  The operative field was prepped with Betadinein a sterile fashion, and a sterile drape was applied covering the operative field. A sterile gown and sterile gloves were used for the procedure. Local anesthesia was provided with 1%  Lidocaine.  Under CT fluoroscopic guidance, a 17 gauge trocar needle was advanced to the margin of the lesion. Once needle tip position was confirmed, coaxial 18-gauge core biopsy samples were obtained, submitted in formalin to surgical pathology. The guide needle was removed. Postprocedure scans show no pneumothorax or significant regional alveolar hemorrhage.  COMPLICATIONS: None immediate  FINDINGS: Anterior left upper lobe mass with mediastinal invasion again localized. CT-guided core biopsy samples were obtained.  IMPRESSION: 1. Technically successful CT-guided core biopsy of left upper lobe lung mass.   Electronically Signed   By: Lucrezia Europe M.D.   On: 01/01/2015 13:20    Labs:  CBC:  Recent Labs  12/24/14 1450 01/01/15 0915 01/21/15 2019 01/22/15 0252  WBC 9.7 10.5 10.2 9.4  HGB 10.6* 10.9* 10.9* 10.0*  HCT 33.5* 34.8* 34.1* 32.1*  PLT 408* 474* 415* 391    COAGS:  Recent Labs  01/01/15 0915 01/22/15 0940  INR 1.06 1.13  APTT 28 30  BMP:  Recent Labs  11/28/14 1349 12/24/14 1450 01/21/15 2019 01/22/15 0252  NA 136 137 135 138  K 4.7 4.8 4.5 4.2  CL  --   --  101 105  CO2 '28 29 26 26  '$ GLUCOSE 88 95 113* 134*  BUN 14.8 13.0 14 11  CALCIUM 9.2 9.2 8.9 8.5*  CREATININE 0.7 0.7 0.77 0.62  GFRNONAA  --   --  >60 >60  GFRAA  --   --  >60 >60    LIVER FUNCTION TESTS:  Recent Labs  11/28/14 1349 12/24/14 1450  BILITOT 0.25 0.24  AST 11 10  ALT 7 11  ALKPHOS 114 116  PROT 7.4 7.3  ALBUMIN 3.1* 3.1*    TUMOR MARKERS: No results for input(s): AFPTM, CEA, CA199, CHROMGRNA in the last 8760 hours.  Assessment and Plan:  Mrs. Corlis Leak is having symptomatic hemoptysis, likely from tumor invasion. The bleeding likely is systemic, but is possibly pulmonary arterial.  She would benefit from bronchial artery embolization, only if she has hypertrophied vessels, but typically this occurs from long standing inflammation rather than tumor invasion. CTA chest will  be ordered to assess the vasculature with further recommendations.  Thank you for this interesting consult.  I greatly enjoyed meeting DENESHIA ZUCKER and look forward to participating in their care.  A copy of this report was sent to the requesting provider on this date.  Signed: Amijah Timothy, ART A 01/22/2015, 10:57 AM   I spent a total of 40 Minutes  in face to face in clinical consultation, greater than 50% of which was counseling/coordinating care for hemoptysis.

## 2015-01-22 NOTE — Consult Note (Signed)
PULMONARY / CRITICAL CARE MEDICINE   Name: Lindsey Fleming MRN: [id number] DOB: 1928/02/19    ADMISSION DATE:  01/21/2015 CONSULTATION DATE:  01/22/2015   REFERRING MD :  Orson Eva  CHIEF COMPLAINT:  Submassive hemoptysis with left upper lobe lung mass  HISTORY OF PRESENT ILLNESS:   81 year old frail female with left upper lobe lung mass. She was seen by Dr. Elsworth Soho in July 2016 status post bronchoscopy with transbronchial biopsies that were nondiagnostic. Bronchoscopy notes indicate narrowing of the left upper lobe airways but no endobronchial lesion. Subsequently approximately a month ago early August 2016 she underwent CT-guided transthoracic needle biopsy that was also nondiagnostic. On a PET scan August 2016 the left upper lobe mass is large and lights up and consistent with stage III non-small cell lung cancer high pretest probability. She has been seen by oncology and because no diagnoses being made she is just on observation therapy. She and her daughter who is a Marine scientist are unsure of the directionality of treatment to undergo. Daughter at test that she's exhausted having biopsies and was leaning towards a palliative approach but did not show.  Of note since January 2016 she's had small amount of streaky occasional hemoptysis but now in the past 24 hours she's had sudden worsening of the hemoptysis being out frank blood each time with a cough. According to the daughter total amount of hemoptysis in a single day is now at half cup. This no fever or chills rigors or  wheeze   PAST MEDICAL HISTORY :   has a past medical history of Cataract and H/O hernia repair.  has past surgical history that includes Abdominal hysterectomy; Video bronchoscopy (Bilateral, 12/04/2014); Cataract extraction (Bilateral); Bladder suspension; and Hernia repair. Prior to Admission medications   Medication Sig Start Date End Date Taking? Authorizing Provider  acetaminophen (TYLENOL) 650 MG CR tablet Take 650 mg by  mouth every 8 (eight) hours as needed for pain.   Yes Historical Provider, MD  LORazepam (ATIVAN) 0.5 MG tablet Take 0.5 mg by mouth 2 (two) times daily as needed for anxiety.  11/30/14  Yes Historical Provider, MD  Multiple Vitamins-Minerals (CENTRUM ADULTS PO) Take 1 each by mouth daily.   Yes Historical Provider, MD  Phenyleph-Doxylamine-DM-APAP (NYQUIL SEVERE COLD/FLU) 5-6.25-10-325 MG/15ML LIQD Take 30 mLs by mouth at bedtime as needed (COUGH).   Yes Historical Provider, MD   No Known Allergies  FAMILY HISTORY:  has no family status information on file.  SOCIAL HISTORY:  reports that she quit smoking about 42 years ago. She has never used smokeless tobacco. She reports that she does not drink alcohol or use illicit drugs.  REVIEW OF SYSTEMS:   Positive for hemoptysis and lung mass   13 point review of systems as per history of present illness Otherwise negative for fever or chills edema orthopnea paroxysmal nocturnal dyspnea nausea vomiting diarrhea no vision joint pain rash  SUBJECTIVE:   VITAL SIGNS: Temp:  [98.1 F (36.7 C)-98.7 F (37.1 C)] 98.3 F (36.8 C) (09/06 0800) Pulse Rate:  [52-142] 113 (09/06 0800) Resp:  [18-31] 19 (09/06 0800) BP: (91-151)/(41-108) 135/71 mmHg (09/06 0800) SpO2:  [91 %-97 %] 95 % (09/06 0800) Weight:  [71.2 kg (156 lb 15.5 oz)] 71.2 kg (156 lb 15.5 oz) (09/06 0100) HEMODYNAMICS:   VENTILATOR SETTINGS:   INTAKE / OUTPUT:  Intake/Output Summary (Last 24 hours) at 01/22/15 0901 Last data filed at 01/22/15 0800  Gross per 24 hour  Intake   1150 ml  Output    725 ml  Net    425 ml    PHYSICAL EXAMINATION: General:  Frail elderly female sitting comfortably in the bed Neuro:  alert and oriented 3. Speech is normal HEENT:  neck is supple no neck nodes  Cardiovascular:  regular rate and rhythm no murmurs Lungs:  clear to auscultation bilaterally  Abdomen:  soft nontender no organomegaly  Musculoskeletal:  no cyanosis no clubbing no  edema Skin:  Intact  LABS:  CBC  Recent Labs Lab 01/21/15 2019 01/22/15 0252  WBC 10.2 9.4  HGB 10.9* 10.0*  HCT 34.1* 32.1*  PLT 415* 391   Coag's No results for input(s): APTT, INR in the last 168 hours. BMET  Recent Labs Lab 01/21/15 2019 01/22/15 0252  NA 135 138  K 4.5 4.2  CL 101 105  CO2 26 26  BUN 14 11  CREATININE 0.77 0.62  GLUCOSE 113* 134*   Electrolytes  Recent Labs Lab 01/21/15 2019 01/22/15 0252  CALCIUM 8.9 8.5*   Sepsis Markers  Recent Labs Lab 01/21/15 2115  LATICACIDVEN 1.55   ABG No results for input(s): PHART, PCO2ART, PO2ART in the last 168 hours. Liver Enzymes No results for input(s): AST, ALT, ALKPHOS, BILITOT, ALBUMIN in the last 168 hours. Cardiac Enzymes No results for input(s): TROPONINI, PROBNP in the last 168 hours. Glucose No results for input(s): GLUCAP in the last 168 hours.  Imaging Dg Chest 2 View  01/21/2015   CLINICAL DATA:  Productive cough for 3 days.  Coughing up blood.  EXAM: CHEST  2 VIEW  COMPARISON:  01/01/2015  FINDINGS: Mass lesion in the left upper lung/ suprahilar region medially, measuring 10.9 by 5.4 cm. This likely extends into the mediastinum as the trachea is displaced towards the left. Mild prominence of the right paratracheal region likely representing lymphadenopathy. Appearance is similar to prior study. There is infiltration or consolidation in the right lung base, similar to prior study. No new parenchymal changes. No blunting of costophrenic angles. Elevation of the right posterior hemidiaphragm. No pneumothorax. Degenerative changes in the spine.  IMPRESSION: Left upper lung mass and probable mediastinal lymphadenopathy. Atelectasis or consolidation in the right lung base. No change since previous study.   Electronically Signed   By: Lucienne Capers M.D.   On: 01/21/2015 21:31     ASSESSMENT / PLAN:  PULMONARY  A   #Large left upper lobe 9.6 cm mass in the chest wall and mediastinal  invasion - based on PET scan August 2016 - Nondiagnostic left upper lobe transbronchial biopsy 12/04/2014  - Nondiagnostic left upper lobe CT-guided transthoracic biopsy 01/01/2015   #Currently with submassive hemoptysis   - Daughter and patient unsure of future direction of therapy. Patient at this general tiredness with repeated biopsy.    P:    -  IR guided bronchial artery embolization recommended    - keep NPO    - discussed with patient and her daughter that regardless of etiology hemoptysis has potential at high pretest probably Tamera Stands getting worse over time and potentially life-threatening aestheticall unpleasing way . Based on this options are IR guided embolization and radiation therapy. Of these 2 procedures radiation therapy will demand one more attempt at making a biopsy diagnosis with endobronchial ultrasound of mediastinal nodes as the best possible option. Both procedures only will have limited range of success given the necrotic nature and large size of the mass . They fully understand this and wish to proceed.   I discussed the  case with Dr. Toni Amend hoss of interventional radiology and Dr. Kara Mead primary pulmonologist   - After embolization attempted will need to discuss goals about one more attempt at tissue diagnosis through  EBUS      Dr. Brand Males, M.D., [facility].C.P Pulmonary and Critical Care Medicine Staff Physician [facility] Pager: [phone], If no answer or between  15:00h - 7:00h: call [phone]  01/22/2015 9:01 AM

## 2015-01-22 NOTE — Progress Notes (Signed)
Lindsey Blinks, NP notified of patient's adventitious lung sounds not previously reported by staff.

## 2015-01-22 NOTE — Progress Notes (Signed)
PROGRESS NOTE  IKHLAS ALBO AVW:[id number] DOB: [birth year] DOA: 01/21/2015 PCP: Simona Huh, MD  Brief History 81 year old female with a history of left upper lobe lung mass presents with one-day history of increasing amounts of hemoptysis. The patient has had intermittent hemoptysis since June 2016. At that time, the patient underwent workup which revealed left upper lobe mass. She has undergone bronchoscopy on 12/04/2014 by Dr. Elsworth Soho. Unfortunately, biopsies were unrevealing. CT-guided biopsy of the mass on 01/01/2015 was subsequently pursued. Again, biopsies showed benign lung parenchyma with fibrosis and inflammation. The patient has had intermittent chest discomfort over the past week, but denies any worsening shortness of breath, dizziness, or dyspnea on exertion. She denies any NSAID use. Pulmonary consult was obtained to assist with management.  Assessment/Plan: Hemoptysis -Check coags -Concerned about pulmonary embolus versus lung mass vascular invasion -CT angiogram chest -Consult pulmonary-discussed with Dr. Chase Caller -Presently stable on room air -Hemoglobin is stable at this time Left upper lobe lung mass -12/21/2014 PET scan shows hypermetabolic mass and lymphadenopathy -The patient is followed by Dr. Earlie Server -As discussed above, 2 separate biopsies have been unrevealing Sinus tachycardia -Although the patient was thought to have atrial fibrillation, review of EKG reveals that it is sinus tachycardia -Discontinue diltiazem drip Normocytic anemia -Hemoglobin is stable Atypical Chest Pain -cycle troponins  Family Communication:   Pt at beside Disposition Plan:   Home when medically stable       Procedures/Studies: Dg Chest 1 View  01/01/2015   CLINICAL DATA:  Status post left lung biopsy.  EXAM: CHEST  1 VIEW  COMPARISON:  Plain film of 12/04/2014 and the biopsy of earlier today. The PET of 12/21/2014.  FINDINGS: The trachea is displaced minimally  to the right. Mild cardiomegaly. No pleural fluid. No pneumothorax. Left upper lobe lung mass, as before. Increased density projecting over the right lung base is secondary to a fat containing right-sided Bochdalek's hernia, when correlated with recent PET.  IMPRESSION: No pneumothorax or other acute finding.  Left upper lobe lung mass, as before.   Electronically Signed   By: Abigail Miyamoto M.D.   On: 01/01/2015 14:09   Dg Chest 2 View  01/21/2015   CLINICAL DATA:  Productive cough for 3 days.  Coughing up blood.  EXAM: CHEST  2 VIEW  COMPARISON:  01/01/2015  FINDINGS: Mass lesion in the left upper lung/ suprahilar region medially, measuring 10.9 by 5.4 cm. This likely extends into the mediastinum as the trachea is displaced towards the left. Mild prominence of the right paratracheal region likely representing lymphadenopathy. Appearance is similar to prior study. There is infiltration or consolidation in the right lung base, similar to prior study. No new parenchymal changes. No blunting of costophrenic angles. Elevation of the right posterior hemidiaphragm. No pneumothorax. Degenerative changes in the spine.  IMPRESSION: Left upper lung mass and probable mediastinal lymphadenopathy. Atelectasis or consolidation in the right lung base. No change since previous study.   Electronically Signed   By: Lucienne Capers M.D.   On: 01/21/2015 21:31   Ct Biopsy  01/01/2015   CLINICAL DATA:  Left upper lobe lung mass with mediastinal invasion  EXAM: CT GUIDED CORE BIOPSY OF LEFT UPPER LOBE LUNG MASS  ANESTHESIA/SEDATION: Intravenous Fentanyl and Versed were administered as conscious sedation during continuous cardiorespiratory monitoring by the radiology RN, with a total moderate sedation time of four minutes.  PROCEDURE: The procedure risks, benefits, and alternatives were explained to the patient.  Questions regarding the procedure were encouraged and answered. The patient understands and consents to the procedure.   Patient placed supine. Select axial scans through the thorax were obtained. The lesion was localized and an appropriate skin entry site was determined and marked.  The operative field was prepped with Betadinein a sterile fashion, and a sterile drape was applied covering the operative field. A sterile gown and sterile gloves were used for the procedure. Local anesthesia was provided with 1% Lidocaine.  Under CT fluoroscopic guidance, a 17 gauge trocar needle was advanced to the margin of the lesion. Once needle tip position was confirmed, coaxial 18-gauge core biopsy samples were obtained, submitted in formalin to surgical pathology. The guide needle was removed. Postprocedure scans show no pneumothorax or significant regional alveolar hemorrhage.  COMPLICATIONS: None immediate  FINDINGS: Anterior left upper lobe mass with mediastinal invasion again localized. CT-guided core biopsy samples were obtained.  IMPRESSION: 1. Technically successful CT-guided core biopsy of left upper lobe lung mass.   Electronically Signed   By: Lucrezia Europe M.D.   On: 01/01/2015 13:20        Subjective: Patient continues to have hemoptysis. She has intermittent chest discomfort. Denies any shortness of breath. Denies fevers, chills, nausea, vomiting, diarrhea, abdominal pain. No dysuria. Denies any hematochezia or melena.  Objective: Filed Vitals:   01/22/15 0300 01/22/15 0341 01/22/15 0422 01/22/15 0800  BP: 91/41  114/56 135/71  Pulse: 110 86 86 113  Temp: 98.7 F (37.1 C)   98.3 F (36.8 C)  TempSrc: Oral   Oral  Resp: '26  29 19  '$ Height:      Weight:      SpO2: 93%  95% 95%    Intake/Output Summary (Last 24 hours) at 01/22/15 0843 Last data filed at 01/22/15 0800  Gross per 24 hour  Intake   1150 ml  Output    725 ml  Net    425 ml   Weight change:  Exam:   General:  Pt is alert, follows commands appropriately, not in acute distress  HEENT: No icterus, No thrush, No neck mass,  [place]/AT  Cardiovascular: RRR, S1/S2, no rubs, no gallops  Respiratory: Diminished breath sounds on the left. Scattered rales. No wheezes.  Abdomen: Soft/+BS, non tender, non distended, no guarding; no hepatosplenomegaly  Extremities: 1+LE edema, No lymphangitis, No petechiae, No rashes, no synovitis  Data Reviewed: Basic Metabolic Panel:  Recent Labs Lab 01/21/15 2019 01/22/15 0252  NA 135 138  K 4.5 4.2  CL 101 105  CO2 26 26  GLUCOSE 113* 134*  BUN 14 11  CREATININE 0.77 0.62  CALCIUM 8.9 8.5*   Liver Function Tests: No results for input(s): AST, ALT, ALKPHOS, BILITOT, PROT, ALBUMIN in the last 168 hours. No results for input(s): LIPASE, AMYLASE in the last 168 hours. No results for input(s): AMMONIA in the last 168 hours. CBC:  Recent Labs Lab 01/21/15 2019 01/22/15 0252  WBC 10.2 9.4  HGB 10.9* 10.0*  HCT 34.1* 32.1*  MCV 88.3 88.9  PLT 415* 391   Cardiac Enzymes: No results for input(s): CKTOTAL, CKMB, CKMBINDEX, TROPONINI in the last 168 hours. BNP: Invalid input(s): POCBNP CBG: No results for input(s): GLUCAP in the last 168 hours.  Recent Results (from the past 240 hour(s))  MRSA PCR Screening     Status: None   Collection Time: 01/22/15  1:20 AM  Result Value Ref Range Status   MRSA by PCR NEGATIVE NEGATIVE Final    Comment:  The GeneXpert MRSA Assay (FDA approved for NASAL specimens only), is one component of a comprehensive MRSA colonization surveillance program. It is not intended to diagnose MRSA infection nor to guide or monitor treatment for MRSA infections.      Scheduled Meds: . sodium chloride  3 mL Intravenous Q12H   Continuous Infusions: . sodium chloride 1,000 mL (01/22/15 0118)  . diltiazem (CARDIZEM) infusion Stopped (01/22/15 0425)     Makenna Macaluso, DO  [facility] Pager [phone]  If 7PM-7AM, please contact night-coverage [email or web address] Password [facility] 01/22/2015, 8:43 AM

## 2015-01-22 NOTE — Progress Notes (Signed)
Patient ID: Lindsey Fleming, female   DOB: Jan 26, 1928, 82 y.o.   MRN: [id number] The CTA chest was reviewed. There are no hypertrophied vessels or bronchial arteries. There is some supply from the left internal mammary artery to the anterior aspect of the mass. I would not recommend embolization at this time unless life threatening hemoptysis ensues.

## 2015-01-23 DIAGNOSIS — R042 Hemoptysis: Secondary | ICD-10-CM | POA: Diagnosis not present

## 2015-01-23 LAB — CBC
HCT: 30.9 % — ABNORMAL LOW (ref 36.0–46.0)
HEMOGLOBIN: 9.7 g/dL — AB (ref 12.0–15.0)
MCH: 27.6 pg (ref 26.0–34.0)
MCHC: 31.4 g/dL (ref 30.0–36.0)
MCV: 88 fL (ref 78.0–100.0)
Platelets: 352 10*3/uL (ref 150–400)
RBC: 3.51 MIL/uL — AB (ref 3.87–5.11)
RDW: 15.9 % — ABNORMAL HIGH (ref 11.5–15.5)
WBC: 8.8 10*3/uL (ref 4.0–10.5)

## 2015-01-23 LAB — BASIC METABOLIC PANEL
Anion gap: 6 (ref 5–15)
BUN: 7 mg/dL (ref 6–20)
CHLORIDE: 107 mmol/L (ref 101–111)
CO2: 24 mmol/L (ref 22–32)
Calcium: 8.2 mg/dL — ABNORMAL LOW (ref 8.9–10.3)
Creatinine, Ser: 0.51 mg/dL (ref 0.44–1.00)
GFR calc Af Amer: >60 mL/min (ref >60)
GFR calc non Af Amer: >60 mL/min (ref >60)
GLUCOSE: 117 mg/dL — AB (ref 65–99)
POTASSIUM: 3.6 mmol/L (ref 3.5–5.1)
Sodium: 137 mmol/L (ref 135–145)

## 2015-01-23 LAB — HEMOGLOBIN A1C
Hgb A1c MFr Bld: 6.1 % — ABNORMAL HIGH (ref 4.8–5.6)
Mean Plasma Glucose: 128 mg/dL

## 2015-01-23 MED ORDER — SODIUM CHLORIDE 0.9 % IV SOLN
1000.0000 mL | INTRAVENOUS | Status: DC
  Administered 2015-01-23: 1000 mL via INTRAVENOUS

## 2015-01-23 MED ORDER — METOPROLOL TARTRATE 25 MG PO TABS
25.0000 mg | ORAL_TABLET | Freq: Two times a day (BID) | ORAL | Status: DC
  Administered 2015-01-23 (×2): 25 mg via ORAL
  Filled 2015-01-23 (×4): qty 1

## 2015-01-23 NOTE — Progress Notes (Signed)
PROGRESS NOTE  GRACELYNNE BENEDICT IWP:[id number] DOB: 1927/08/22 DOA: 01/21/2015 PCP: Simona Huh, MD  Brief History 81 year old female with a history of left upper lobe lung mass presents with one-day history of increasing amounts of hemoptysis. The patient has had intermittent hemoptysis since June 2016. At that time, the patient underwent workup which revealed left upper lobe mass. She has undergone bronchoscopy on 12/04/2014 by Dr. Elsworth Soho. Unfortunately, biopsies were unrevealing. CT-guided biopsy of the mass on 01/01/2015 was subsequently pursued. Again, biopsies showed benign lung parenchyma with fibrosis and inflammation. The patient has had intermittent chest discomfort over the past week, but denies any worsening shortness of breath, dizziness, or dyspnea on exertion. She denies any NSAID use. Pulmonary consult was obtained to assist with management.    Subjective: Patient in bed, had small hemoptysis early morning 01/23/2015, no chest pain or abdominal pain, no shortness of breath. No focal weakness.   Assessment/Plan:  Hemoptysis -With CT concerning for left upper lobe mass with necrosis possible malignancy. So far 2 biopsies in the past have been inconclusive. CT angiogram chest done. Seen by IR. Not a candidate for embolization per IR. Pulmonary following we'll defer the plan to pulmonary. May require repeat bronchoscopy. Daughter updated bedside daughter and patient agreeable for bronchoscopy if needed.  Left upper lobe lung mass -12/21/2014 PET scan shows hypermetabolic mass and lymphadenopathy -The patient is followed by Dr. Earlie Server -As discussed above, plan per pulmonary.  Sinus tachycardia -Although the patient was thought to have atrial fibrillation, review of EKG reveals that it is sinus tachycardia -On beta blocker continue.  Normocytic anemia likely due to combination of chronic disease and some blood loss from hemoptysis -Hemoglobin is stable, monitor  clinically.  Atypical Chest Pain -Chest pain is resolved likely due to #1 above, troponin negative. EKG nonacute.   Family Communication:   Pt and daughter at beside Disposition Plan:   Home when medically stable DVT prophylaxis. SCDs.      Procedures/Studies: Dg Chest 1 View  01/01/2015   CLINICAL DATA:  Status post left lung biopsy.  EXAM: CHEST  1 VIEW  COMPARISON:  Plain film of 12/04/2014 and the biopsy of earlier today. The PET of 12/21/2014.  FINDINGS: The trachea is displaced minimally to the right. Mild cardiomegaly. No pleural fluid. No pneumothorax. Left upper lobe lung mass, as before. Increased density projecting over the right lung base is secondary to a fat containing right-sided Bochdalek's hernia, when correlated with recent PET.  IMPRESSION: No pneumothorax or other acute finding.  Left upper lobe lung mass, as before.   Electronically Signed   By: Abigail Miyamoto M.D.   On: 01/01/2015 14:09   Dg Chest 2 View  01/21/2015   CLINICAL DATA:  Productive cough for 3 days.  Coughing up blood.  EXAM: CHEST  2 VIEW  COMPARISON:  01/01/2015  FINDINGS: Mass lesion in the left upper lung/ suprahilar region medially, measuring 10.9 by 5.4 cm. This likely extends into the mediastinum as the trachea is displaced towards the left. Mild prominence of the right paratracheal region likely representing lymphadenopathy. Appearance is similar to prior study. There is infiltration or consolidation in the right lung base, similar to prior study. No new parenchymal changes. No blunting of costophrenic angles. Elevation of the right posterior hemidiaphragm. No pneumothorax. Degenerative changes in the spine.  IMPRESSION: Left upper lung mass and probable mediastinal lymphadenopathy. Atelectasis or consolidation in the right lung base. No change since  previous study.   Electronically Signed   By: Lucienne Capers M.D.   On: 01/21/2015 21:31   Ct Biopsy  01/01/2015   CLINICAL DATA:  Left upper lobe lung  mass with mediastinal invasion  EXAM: CT GUIDED CORE BIOPSY OF LEFT UPPER LOBE LUNG MASS  ANESTHESIA/SEDATION: Intravenous Fentanyl and Versed were administered as conscious sedation during continuous cardiorespiratory monitoring by the radiology RN, with a total moderate sedation time of four minutes.  PROCEDURE: The procedure risks, benefits, and alternatives were explained to the patient. Questions regarding the procedure were encouraged and answered. The patient understands and consents to the procedure.  Patient placed supine. Select axial scans through the thorax were obtained. The lesion was localized and an appropriate skin entry site was determined and marked.  The operative field was prepped with Betadinein a sterile fashion, and a sterile drape was applied covering the operative field. A sterile gown and sterile gloves were used for the procedure. Local anesthesia was provided with 1% Lidocaine.  Under CT fluoroscopic guidance, a 17 gauge trocar needle was advanced to the margin of the lesion. Once needle tip position was confirmed, coaxial 18-gauge core biopsy samples were obtained, submitted in formalin to surgical pathology. The guide needle was removed. Postprocedure scans show no pneumothorax or significant regional alveolar hemorrhage.  COMPLICATIONS: None immediate  FINDINGS: Anterior left upper lobe mass with mediastinal invasion again localized. CT-guided core biopsy samples were obtained.  IMPRESSION: 1. Technically successful CT-guided core biopsy of left upper lobe lung mass.   Electronically Signed   By: Lucrezia Europe M.D.   On: 01/01/2015 13:20   Ct Angio Chest Aorta W/cm &/or Wo/cm  01/22/2015   CLINICAL DATA:  81 year old female with history of lung cancer. Hemoptysis. Evaluate for potential hypertrophied bronchial artery to target intervention.  EXAM: CT ANGIOGRAPHY CHEST WITH CONTRAST  TECHNIQUE: Multidetector CT imaging of the chest was performed using the standard protocol during bolus  administration of intravenous contrast. Multiplanar CT image reconstructions and MIPs were obtained to evaluate the vascular anatomy.  CONTRAST:  118m OMNIPAQUE IOHEXOL 350 MG/ML SOLN  COMPARISON:  Chest CT 06/19/2014.  FINDINGS: Mediastinum/Lymph Nodes: Heart size is normal. Old small amount of pericardial fluid and/or thickening, unlikely to be of hemodynamic significance at this time. No associated pericardial calcification. There is atherosclerosis of the thoracic aorta, the great vessels of the mediastinum and the coronary arteries, including calcified atherosclerotic plaque in the left main, left anterior descending, left circumflex and right coronary arteries. Calcifications of the mitral annulus and mitral valve. Numerous borderline and mildly enlarged mediastinal lymph nodes measuring up to 11 mm in the subcarinal nodal station. Prominent nodal tissue in the left hilar region measuring up to 12 mm in thickness. Marked narrowing of the left main pulmonary artery, related to extrinsic compression from the large left upper lobe mass. This mass also completely occludes several contributing branches to the left superior pulmonary vein. The mass sits immediately adjacent to the AP window, with evidence of early direct mediastinal invasion through the AP window. Small hiatal hernia. No axillary lymphadenopathy.  Lungs/Pleura: Large left upper lobe mass has significantly increased compared to the prior study, currently measuring 7.8 x 11.0 x 8.8 cm. This mass appears to be partially perfused by numerous collateral branches off the left internal mammary artery. No large hypertrophied bronchial arteries are confidently identified contributing to this mass, although several small bronchial arteries are noted, which do appear to extend into this mass. This mass makes a broad  contact with the mediastinum, with evidence of direct invasion into the AP window, and comes in direct contact with the lateral aspect of the  aortic arch, with complete obscuration of the intervening fat plane. Postobstructive changes are noted in the adjacent portions of the left upper lobe, and there is some nodular septal thickening, which suggests early lymphangitic spread of disease. Small left pleural effusion lying dependently, likely malignant. Right lung appears clear. Large Bochdalek's hernia on the right side.  Upper Abdomen: Moderate to severe right hydronephrosis, similar to recent PET-CT 12/21/2014. Small hypervascular lesion measuring 1.2 cm in the lateral aspect of the spleen, both of which appear to be new compared to the prior examination, concerning for potential metastatic lesions.  Musculoskeletal/Soft Tissues: There are no aggressive appearing lytic or blastic lesions noted in the visualized portions of the skeleton.  Review of the MIP images confirms the above findings.  IMPRESSION: 1. Enlarging left upper lobe mass which currently measures 7.8 x 11.0 x 8.8 cm, demonstrating evidence of direct mediastinal invasion, with multiple borderline enlarged and mildly enlarged bilateral mediastinal lymph nodes which are likely metastatic, small left pleural effusion which is likely malignant, and a new 1.2 cm hypervascular lesion in the spleen, suspicious for potential metastasis. 2. No hypertrophied bronchial arteries confidently identified to serve as suitable targets for potential embolization in this patient with history of hemoptysis. Much of the upper and anterior portion of the mass appears to be largely perfused by branches from the left internal mammary artery. 3. Moderate to severe right-hydroureteronephrosis again noted, similar to recent PET-CT. 4. Additional incidental findings, as above.   Electronically Signed   By: Vinnie Langton M.D.   On: 01/22/2015 15:02       Objective: Filed Vitals:   01/23/15 0725 01/23/15 0727 01/23/15 1000 01/23/15 1100  BP: 125/74  147/122 110/66  Pulse: 109  82 74  Temp:  97.6 F (36.4  C)    TempSrc:  Oral    Resp: '28  30 20  '$ Height:      Weight:      SpO2: 96%  96% 97%    Intake/Output Summary (Last 24 hours) at 01/23/15 1126 Last data filed at 01/23/15 1100  Gross per 24 hour  Intake   1555 ml  Output   1350 ml  Net    205 ml   Weight change:  Exam:   General:  Pt is alert, follows commands appropriately, not in acute distress  HEENT: No icterus, No thrush, No neck mass, [place]/AT  Cardiovascular: RRR, S1/S2, no rubs, no gallops  Respiratory: Diminished breath sounds on the left. Scattered rales. No wheezes.  Abdomen: Soft/+BS, non tender, non distended, no guarding; no hepatosplenomegaly  Extremities: 1+LE edema, No lymphangitis, No petechiae, No rashes, no synovitis  Data Reviewed: Basic Metabolic Panel:  Recent Labs Lab 01/21/15 2019 01/22/15 0252 01/23/15 0231  NA 135 138 137  K 4.5 4.2 3.6  CL 101 105 107  CO2 '26 26 24  '$ GLUCOSE 113* 134* 117*  BUN '14 11 7  '$ CREATININE 0.77 0.62 0.51  CALCIUM 8.9 8.5* 8.2*   Liver Function Tests: No results for input(s): AST, ALT, ALKPHOS, BILITOT, PROT, ALBUMIN in the last 168 hours. No results for input(s): LIPASE, AMYLASE in the last 168 hours. No results for input(s): AMMONIA in the last 168 hours. CBC:  Recent Labs Lab 01/21/15 2019 01/22/15 0252 01/22/15 1825 01/23/15 0231  WBC 10.2 9.4 10.6* 8.8  HGB 10.9* 10.0* 11.0* 9.7*  HCT 34.1* 32.1* 33.9* 30.9*  MCV 88.3 88.9 89.4 88.0  PLT 415* 391 402* 352   Cardiac Enzymes: No results for input(s): CKTOTAL, CKMB, CKMBINDEX, TROPONINI in the last 168 hours. BNP: Invalid input(s): POCBNP CBG: No results for input(s): GLUCAP in the last 168 hours.  Recent Results (from the past 240 hour(s))  MRSA PCR Screening     Status: None   Collection Time: 01/22/15  1:20 AM  Result Value Ref Range Status   MRSA by PCR NEGATIVE NEGATIVE Final    Comment:        The GeneXpert MRSA Assay (FDA approved for NASAL specimens only), is one component of  a comprehensive MRSA colonization surveillance program. It is not intended to diagnose MRSA infection nor to guide or monitor treatment for MRSA infections.      Scheduled Meds: . metoprolol tartrate  25 mg Oral BID  . sodium chloride  3 mL Intravenous Q12H   Continuous Infusions: . sodium chloride 1,000 mL (01/23/15 4451)     Lala Lund K M.D on 01/23/2015 at 11:26 AM  Between 7am to 7pm - Pager - [phone], After 7pm go to [email or web address] - password [facility]  [facility]  - Office  [phone]

## 2015-01-23 NOTE — Progress Notes (Signed)
  Radiation Oncology         [phone]) [phone] ________________________________  Name: Lindsey Fleming MRN: [id number]  Date: 01/21/2015  DOB: 1927/12/25  Chart Note:  I was contacted by Dr. Vaughan Browner about this patient.  She is an 81 yo woman with what appears to be stage T3N2 primary left upper lung cancer without tissue confirmation.  The patient was initially evaluated in the cancer center almost 2 months ago, and unfortunately, two attempts to get tissue have been non-diagnostic.  The patient is currently admitted with hemoptysis, and Dr. Vaughan Browner and the patient family members are looking for guidance on whether we should make another attempt for biopsy or potentially treat empirically treat with radiation.  I have reviewed the records and films and will discuss options with the patient and her family tomorrow.  Stage IIIA lung cancer has become a relatively heterogeneous disease with a variety of treatment options and prognoses, ranging from EGFR positive adenocarcinoma that may respond well to oral targetted agents to other histologies that would do well with radiation and/or chemotherapy.  For this reason, it would be ideal to consider bronchoscopy with EBUS to confirm the diagnosis.  If biopsy is not possible or not desired by the patient, a brief course of radiotherapy to the chest may provide palliation of hemoptysis with limited morbidity.  There are pros and cons to this complex decision. ________________________________  Sheral Apley Tammi Klippel, M.D.

## 2015-01-23 NOTE — Progress Notes (Signed)
Paged MD Tat and notified of pt's HR sustained 120's-130's. MD Tat arrived at bedside. No orders received. Will continue to monitor.

## 2015-01-23 NOTE — Progress Notes (Signed)
I have spoken briefly with Ms. Arens and daughter, [name], and they agree to meet tomorrow morning (I will come after my previously scheduled 0830 appt with another family). Thank you for this consult.   Vinie Sill, NP Palliative Medicine Team Pager # [phone] (M-F 8a-5p) Team Phone # [phone] (Nights/Weekends)

## 2015-01-23 NOTE — Progress Notes (Signed)
PULMONARY / CRITICAL CARE MEDICINE   Name: Lindsey Fleming MRN: [id number] DOB: 1927/05/25    ADMISSION DATE:  01/21/2015 CONSULTATION DATE:  01/22/2015   REFERRING MD :  Orson Eva  CHIEF COMPLAINT:  Submassive hemoptysis with left upper lobe lung mass. Has undergone 2 biopsies with no diagnosis. Now presenting with submassive hemoptysis. Unable to embolize per IR. PCCM consulted for further management.   INTERVAL: Cough improved over night and this AM as well as hemoptysis, slept better last night. IR could not find artery to embolize on CT scan, so she is not a candidate for this.   STUDIES: CTA chest 9/6 > Enlarging left upper lobe mass which currently measures 7.8 x 11.0 x 8.8 cm, demonstrating evidence of direct mediastinal invasion. Bilateral mediastinal lymph node enlargement. New small left pleural effusion. New 1.2 cm hypervascular lesion in the spleen concerning for mets.   VITAL SIGNS: Temp:  [97.6 F (36.4 C)-98.5 F (36.9 C)] 97.6 F (36.4 C) (09/07 0727) Pulse Rate:  [52-120] 74 (09/07 1100) Resp:  [19-30] 20 (09/07 1100) BP: (110-155)/(61-122) 110/66 mmHg (09/07 1100) SpO2:  [94 %-97 %] 97 % (09/07 1100) HEMODYNAMICS:   VENTILATOR SETTINGS:   INTAKE / OUTPUT:  Intake/Output Summary (Last 24 hours) at 01/23/15 1142 Last data filed at 01/23/15 1100  Gross per 24 hour  Intake   1555 ml  Output   1350 ml  Net    205 ml    PHYSICAL EXAMINATION: General:  Frail elderly female sitting comfortably in the bed Neuro:  alert and oriented 3. Speech is normal HEENT:  neck is supple no neck nodes  Cardiovascular:  regular rate and rhythm no murmurs Lungs:  clear to auscultation bilaterally  Abdomen:  soft nontender no organomegaly  Musculoskeletal:  no cyanosis no clubbing no edema Skin:  Intact  LABS:  CBC  Recent Labs Lab 01/22/15 0252 01/22/15 1825 01/23/15 0231  WBC 9.4 10.6* 8.8  HGB 10.0* 11.0* 9.7*  HCT 32.1* 33.9* 30.9*  PLT 391 402* 352    Coag's  Recent Labs Lab 01/22/15 0940  APTT 30  INR 1.13   BMET  Recent Labs Lab 01/21/15 2019 01/22/15 0252 01/23/15 0231  NA 135 138 137  K 4.5 4.2 3.6  CL 101 105 107  CO2 '26 26 24  '$ BUN '14 11 7  '$ CREATININE 0.77 0.62 0.51  GLUCOSE 113* 134* 117*   Electrolytes  Recent Labs Lab 01/21/15 2019 01/22/15 0252 01/23/15 0231  CALCIUM 8.9 8.5* 8.2*   Sepsis Markers  Recent Labs Lab 01/21/15 2115  LATICACIDVEN 1.55   ABG No results for input(s): PHART, PCO2ART, PO2ART in the last 168 hours. Liver Enzymes No results for input(s): AST, ALT, ALKPHOS, BILITOT, ALBUMIN in the last 168 hours. Cardiac Enzymes No results for input(s): TROPONINI, PROBNP in the last 168 hours. Glucose No results for input(s): GLUCAP in the last 168 hours.  Imaging Ct Angio Chest Aorta W/cm &/or Wo/cm  01/22/2015   CLINICAL DATA:  81 year old female with history of lung cancer. Hemoptysis. Evaluate for potential hypertrophied bronchial artery to target intervention.  EXAM: CT ANGIOGRAPHY CHEST WITH CONTRAST  TECHNIQUE: Multidetector CT imaging of the chest was performed using the standard protocol during bolus administration of intravenous contrast. Multiplanar CT image reconstructions and MIPs were obtained to evaluate the vascular anatomy.  CONTRAST:  129m OMNIPAQUE IOHEXOL 350 MG/ML SOLN  COMPARISON:  Chest CT 06/19/2014.  FINDINGS: Mediastinum/Lymph Nodes: Heart size is normal. Old small amount of pericardial fluid  and/or thickening, unlikely to be of hemodynamic significance at this time. No associated pericardial calcification. There is atherosclerosis of the thoracic aorta, the great vessels of the mediastinum and the coronary arteries, including calcified atherosclerotic plaque in the left main, left anterior descending, left circumflex and right coronary arteries. Calcifications of the mitral annulus and mitral valve. Numerous borderline and mildly enlarged mediastinal lymph nodes  measuring up to 11 mm in the subcarinal nodal station. Prominent nodal tissue in the left hilar region measuring up to 12 mm in thickness. Marked narrowing of the left main pulmonary artery, related to extrinsic compression from the large left upper lobe mass. This mass also completely occludes several contributing branches to the left superior pulmonary vein. The mass sits immediately adjacent to the AP window, with evidence of early direct mediastinal invasion through the AP window. Small hiatal hernia. No axillary lymphadenopathy.  Lungs/Pleura: Large left upper lobe mass has significantly increased compared to the prior study, currently measuring 7.8 x 11.0 x 8.8 cm. This mass appears to be partially perfused by numerous collateral branches off the left internal mammary artery. No large hypertrophied bronchial arteries are confidently identified contributing to this mass, although several small bronchial arteries are noted, which do appear to extend into this mass. This mass makes a broad contact with the mediastinum, with evidence of direct invasion into the AP window, and comes in direct contact with the lateral aspect of the aortic arch, with complete obscuration of the intervening fat plane. Postobstructive changes are noted in the adjacent portions of the left upper lobe, and there is some nodular septal thickening, which suggests early lymphangitic spread of disease. Small left pleural effusion lying dependently, likely malignant. Right lung appears clear. Large Bochdalek's hernia on the right side.  Upper Abdomen: Moderate to severe right hydronephrosis, similar to recent PET-CT 12/21/2014. Small hypervascular lesion measuring 1.2 cm in the lateral aspect of the spleen, both of which appear to be new compared to the prior examination, concerning for potential metastatic lesions.  Musculoskeletal/Soft Tissues: There are no aggressive appearing lytic or blastic lesions noted in the visualized portions of  the skeleton.  Review of the MIP images confirms the above findings.  IMPRESSION: 1. Enlarging left upper lobe mass which currently measures 7.8 x 11.0 x 8.8 cm, demonstrating evidence of direct mediastinal invasion, with multiple borderline enlarged and mildly enlarged bilateral mediastinal lymph nodes which are likely metastatic, small left pleural effusion which is likely malignant, and a new 1.2 cm hypervascular lesion in the spleen, suspicious for potential metastasis. 2. No hypertrophied bronchial arteries confidently identified to serve as suitable targets for potential embolization in this patient with history of hemoptysis. Much of the upper and anterior portion of the mass appears to be largely perfused by branches from the left internal mammary artery. 3. Moderate to severe right-hydroureteronephrosis again noted, similar to recent PET-CT. 4. Additional incidental findings, as above.   Electronically Signed   By: Vinnie Langton M.D.   On: 01/22/2015 15:02     ASSESSMENT / PLAN:  PULMONARY  A  Large left upper lobe 9.6 cm mass in the chest wall and mediastinal invasion - based on PET scan August 2016. Enlarging based on CT 9/6 with likely new mets to spleen.  - Nondiagnostic left upper lobe transbronchial biopsy 12/04/2014  - Nondiagnostic left upper lobe CT-guided transthoracic biopsy 01/01/2015 Submassive hemoptysis - Daughter and patient unsure of future direction of therapy. Patient at this general tiredness with repeated biopsy. New small L  pleural effusion?   P:   Unable to embolize via IR Hemoptysis/cough improved, but persistent Family and patient willing for one more attempt at tissues diagnosis Will likely need EBUS for this Consider thoracentesis L effusion for tissue diagnosis, but likely difficult to obtain due to small size of effusion and low yield even if obtained.  Open to discussing goals of care after decision made about daignosis They do not want to be  over-aggressive, however would like tissue diagnosis if low risk.  Attending to follow  Georgann Housekeeper, AGACNP-BC [facility] Pulmonology/Critical Care Pager [phone] or [phone]  01/23/2015 11:50 AM

## 2015-01-24 ENCOUNTER — Ambulatory Visit: Admit: 2015-01-24 | Payer: Medicare Other | Admitting: Radiation Oncology

## 2015-01-24 DIAGNOSIS — Z515 Encounter for palliative care: Secondary | ICD-10-CM

## 2015-01-24 DIAGNOSIS — R739 Hyperglycemia, unspecified: Secondary | ICD-10-CM | POA: Diagnosis not present

## 2015-01-24 DIAGNOSIS — R042 Hemoptysis: Secondary | ICD-10-CM | POA: Diagnosis not present

## 2015-01-24 DIAGNOSIS — R590 Localized enlarged lymph nodes: Secondary | ICD-10-CM | POA: Diagnosis not present

## 2015-01-24 DIAGNOSIS — R918 Other nonspecific abnormal finding of lung field: Secondary | ICD-10-CM | POA: Diagnosis not present

## 2015-01-24 MED ORDER — METOPROLOL TARTRATE 50 MG PO TABS: 50.0000 mg | ORAL_TABLET | Freq: Two times a day (BID) | ORAL | Status: DC

## 2015-01-24 MED ORDER — METOPROLOL TARTRATE 1 MG/ML IV SOLN
5.0000 mg | Freq: Once | INTRAVENOUS | Status: AC
  Administered 2015-01-24: 5 mg via INTRAVENOUS
  Filled 2015-01-24: qty 5

## 2015-01-24 MED ORDER — METOPROLOL TARTRATE 50 MG PO TABS
50.0000 mg | ORAL_TABLET | Freq: Two times a day (BID) | ORAL | Status: DC
Start: 2015-01-24 — End: 2015-01-24
  Filled 2015-01-24: qty 1

## 2015-01-24 MED ORDER — GUAIFENESIN-CODEINE 100-10 MG/5ML PO SOLN: 10.0000 mL | ORAL | Status: AC | PRN

## 2015-01-24 MED ORDER — METOPROLOL TARTRATE 50 MG PO TABS
50.0000 mg | ORAL_TABLET | Freq: Two times a day (BID) | ORAL | Status: DC
  Administered 2015-01-24: 50 mg via ORAL
  Filled 2015-01-24 (×2): qty 1

## 2015-01-24 NOTE — Discharge Summary (Signed)
Lindsey Fleming, is a 81 y.o. female  DOB Sep 22, 1927  MRN [id number].  Admission date:  01/21/2015  Admitting Physician  Etta Quill, DO  Discharge Date:  01/24/2015   Primary MD  Simona Huh, MD  Recommendations for primary care physician for things to follow:   Outpatient CBC and BMP monitoring.   Admission Diagnosis  Cavitating mass in left upper lung lobe [R91.8] Hemoptysis [R04.2]   Discharge Diagnosis  Cavitating mass in left upper lung lobe [R91.8] Hemoptysis [R04.2]    Principal Problem:   Hemoptysis Active Problems:   Cavitating mass in left upper lung lobe   Mediastinal lymphadenopathy   Atrial fibrillation with RVR   Sinus tachycardia   Hyperglycemia   Massive hemoptysis   Palliative care encounter      Past Medical History  Diagnosis Date  . Cataract     bilateral  . H/O hernia repair     Past Surgical History  Procedure Laterality Date  . Abdominal hysterectomy    . Video bronchoscopy Bilateral 12/04/2014    Procedure: VIDEO BRONCHOSCOPY WITH FLUORO;  Surgeon: Rigoberto Noel, MD;  Location: [facility];  Service: Cardiopulmonary;  Laterality: Bilateral;  . Cataract extraction Bilateral   . Bladder suspension    . Hernia repair         HPI  from the history and physical done on the day of admission:    Lindsey Fleming is a 81 y.o. female with h/o known lung tumor which looks to be a stage 3 bronchogenic carcinoma with lymphnode involvement. Inconclusive biopsy. Patient presents to the ED with hemoptysis that onset at 5pm. Frequent coughing productive of blood. Has been similarly having problems with hemoptysis for several months. Patient loosing weight and voice has been becoming hoarse.  EDP tried to get in touch with pulmonology on call but was unable to do so  (see Dr. Colin Broach note). Hospitalist admitting to try again in AM.     Hospital Course:    Hemoptysis -With CT concerning for left upper lobe mass with necrosis possible malignancy. So far 2 biopsies in the past have been inconclusive. CT angiogram chest done. Seen by IR. Not a candidate for embolization per IR. Pulmonary the patient but patient and family did not agree for a repeat bronchoscopy at this time, they wanted to undergo radiation if possible without 100% confirmation of the pathology. Radiation oncology was consulted. Most likely patient will undergo radiation treatments under the care of Dr. Tammi Klippel, I discussed the care with Dr. Tammi Klippel who will see the patient tomorrow in the office. No further hemoptysis. Long-term prognosis appears poor. She will also follow with her primary pulmonologist Dr. Elsworth Soho on a close basis post discharge.   Left upper lobe lung mass -12/21/2014 PET scan shows hypermetabolic mass and lymphadenopathy -The patient is followed by Dr. Earlie Server -As discussed above, plan per pulmonary.  Sinus tachycardia -Although the patient was thought to have atrial fibrillation, review of EKG reveals that it is sinus tachycardia with PACs. Currently  in good control with beta blocker.    Normocytic anemia likely due to combination of chronic disease and some blood loss from hemoptysis -Hemoglobin is stable, monitor clinically.  Atypical Chest Pain -Chest pain is resolved likely due to #1 above, troponin negative. EKG nonacute.        Discharge Condition: Fait  Follow UP  Follow-up Information    Follow up with Simona Huh, MD. Schedule an appointment as soon as possible for a visit in 1 week.   Specialty:  Family Medicine   Contact information:   [address] [place] [address] [phone]       Follow up with Tyler Pita, MD. Schedule an appointment as soon as possible for a visit in 1 week.   Specialty:  Radiation  Oncology   Contact information:   [address] [place] [address] [place], Rad.Oncology  Diet and Activity recommendation: See Discharge Instructions below  Discharge Instructions           Discharge Instructions    Diet - low sodium heart healthy    Complete by:  As directed      Discharge instructions    Complete by:  As directed   Follow with Primary MD Simona Huh, MD in 7 days   Get CBC, CMP, 2 view Chest X ray checked  by Primary MD next visit.    Activity: As tolerated with Full fall precautions use walker/cane & assistance as needed   Disposition Home     Diet: Heart Healthy    For Heart failure patients - Check your Weight same time everyday, if you gain over 2 pounds, or you develop in leg swelling, experience more shortness of breath or chest pain, call your Primary MD immediately. Follow Cardiac Low Salt Diet and 1.5 lit/day fluid restriction.   On your next visit with your primary care physician please Get Medicines reviewed and adjusted.   Please request your Prim.MD to go over all Hospital Tests and Procedure/Radiological results at the follow up, please get all Hospital records sent to your Prim MD by signing hospital release before you go home.   If you experience worsening of your admission symptoms, develop shortness of breath, life threatening emergency, suicidal or homicidal thoughts you must seek medical attention immediately by calling 911 or calling your MD immediately  if symptoms less severe.  You Must read complete instructions/literature along with all the possible adverse reactions/side effects for all the Medicines you take and that have been prescribed to you. Take any new Medicines after you have completely understood and accpet all the possible adverse reactions/side effects.   Do not drive, operating heavy machinery, perform activities at heights, swimming or participation in water  activities or provide baby sitting services if your were admitted for syncope or siezures until you have seen by Primary MD or a Neurologist and advised to do so again.  Do not drive when taking Pain medications.    Do not take more than prescribed Pain, Sleep and Anxiety Medications  Special Instructions: If you have smoked or chewed Tobacco  in the last 2 yrs please stop smoking, stop any regular Alcohol  and or any Recreational drug use.  Wear Seat belts while driving.   Please note  You were cared for by a hospitalist during your hospital stay. If you have any questions about your discharge medications or the care you received while  you were in the hospital after you are discharged, you can call the unit and asked to speak with the hospitalist on call if the hospitalist that took care of you is not available. Once you are discharged, your primary care physician will handle any further medical issues. Please note that NO REFILLS for any discharge medications will be authorized once you are discharged, as it is imperative that you return to your primary care physician (or establish a relationship with a primary care physician if you do not have one) for your aftercare needs so that they can reassess your need for medications and monitor your lab values.     Increase activity slowly    Complete by:  As directed              Discharge Medications       Medication List    STOP taking these medications        NYQUIL SEVERE COLD/FLU 5-6.25-10-325 MG/15ML Liqd  Generic drug:  Phenyleph-Doxylamine-DM-APAP      TAKE these medications        acetaminophen 650 MG CR tablet  Commonly known as:  TYLENOL  Take 650 mg by mouth every 8 (eight) hours as needed for pain.     CENTRUM ADULTS PO  Take 1 each by mouth daily.     guaiFENesin-codeine 100-10 MG/5ML syrup  Take 10 mLs by mouth every 4 (four) hours as needed for cough.     LORazepam 0.5 MG tablet  Commonly known as:  ATIVAN    Take 0.5 mg by mouth 2 (two) times daily as needed for anxiety.     metoprolol 50 MG tablet  Commonly known as:  LOPRESSOR  Take 1 tablet (50 mg total) by mouth 2 (two) times daily.        Major procedures and Radiology Reports - PLEASE review detailed and final reports for all details, in brief -       Dg Chest 1 View  01/01/2015   CLINICAL DATA:  Status post left lung biopsy.  EXAM: CHEST  1 VIEW  COMPARISON:  Plain film of 12/04/2014 and the biopsy of earlier today. The PET of 12/21/2014.  FINDINGS: The trachea is displaced minimally to the right. Mild cardiomegaly. No pleural fluid. No pneumothorax. Left upper lobe lung mass, as before. Increased density projecting over the right lung base is secondary to a fat containing right-sided Bochdalek's hernia, when correlated with recent PET.  IMPRESSION: No pneumothorax or other acute finding.  Left upper lobe lung mass, as before.   Electronically Signed   By: Abigail Miyamoto M.D.   On: 01/01/2015 14:09   Dg Chest 2 View  01/21/2015   CLINICAL DATA:  Productive cough for 3 days.  Coughing up blood.  EXAM: CHEST  2 VIEW  COMPARISON:  01/01/2015  FINDINGS: Mass lesion in the left upper lung/ suprahilar region medially, measuring 10.9 by 5.4 cm. This likely extends into the mediastinum as the trachea is displaced towards the left. Mild prominence of the right paratracheal region likely representing lymphadenopathy. Appearance is similar to prior study. There is infiltration or consolidation in the right lung base, similar to prior study. No new parenchymal changes. No blunting of costophrenic angles. Elevation of the right posterior hemidiaphragm. No pneumothorax. Degenerative changes in the spine.  IMPRESSION: Left upper lung mass and probable mediastinal lymphadenopathy. Atelectasis or consolidation in the right lung base. No change since previous study.   Electronically Signed   By: Gwyndolyn Saxon  Gerilyn Nestle M.D.   On: 01/21/2015 21:31   Ct  Biopsy  01/01/2015   CLINICAL DATA:  Left upper lobe lung mass with mediastinal invasion  EXAM: CT GUIDED CORE BIOPSY OF LEFT UPPER LOBE LUNG MASS  ANESTHESIA/SEDATION: Intravenous Fentanyl and Versed were administered as conscious sedation during continuous cardiorespiratory monitoring by the radiology RN, with a total moderate sedation time of four minutes.  PROCEDURE: The procedure risks, benefits, and alternatives were explained to the patient. Questions regarding the procedure were encouraged and answered. The patient understands and consents to the procedure.  Patient placed supine. Select axial scans through the thorax were obtained. The lesion was localized and an appropriate skin entry site was determined and marked.  The operative field was prepped with Betadinein a sterile fashion, and a sterile drape was applied covering the operative field. A sterile gown and sterile gloves were used for the procedure. Local anesthesia was provided with 1% Lidocaine.  Under CT fluoroscopic guidance, a 17 gauge trocar needle was advanced to the margin of the lesion. Once needle tip position was confirmed, coaxial 18-gauge core biopsy samples were obtained, submitted in formalin to surgical pathology. The guide needle was removed. Postprocedure scans show no pneumothorax or significant regional alveolar hemorrhage.  COMPLICATIONS: None immediate  FINDINGS: Anterior left upper lobe mass with mediastinal invasion again localized. CT-guided core biopsy samples were obtained.  IMPRESSION: 1. Technically successful CT-guided core biopsy of left upper lobe lung mass.   Electronically Signed   By: Lucrezia Europe M.D.   On: 01/01/2015 13:20   Ct Angio Chest Aorta W/cm &/or Wo/cm  01/22/2015   CLINICAL DATA:  81 year old female with history of lung cancer. Hemoptysis. Evaluate for potential hypertrophied bronchial artery to target intervention.  EXAM: CT ANGIOGRAPHY CHEST WITH CONTRAST  TECHNIQUE: Multidetector CT imaging of the  chest was performed using the standard protocol during bolus administration of intravenous contrast. Multiplanar CT image reconstructions and MIPs were obtained to evaluate the vascular anatomy.  CONTRAST:  157m OMNIPAQUE IOHEXOL 350 MG/ML SOLN  COMPARISON:  Chest CT 06/19/2014.  FINDINGS: Mediastinum/Lymph Nodes: Heart size is normal. Old small amount of pericardial fluid and/or thickening, unlikely to be of hemodynamic significance at this time. No associated pericardial calcification. There is atherosclerosis of the thoracic aorta, the great vessels of the mediastinum and the coronary arteries, including calcified atherosclerotic plaque in the left main, left anterior descending, left circumflex and right coronary arteries. Calcifications of the mitral annulus and mitral valve. Numerous borderline and mildly enlarged mediastinal lymph nodes measuring up to 11 mm in the subcarinal nodal station. Prominent nodal tissue in the left hilar region measuring up to 12 mm in thickness. Marked narrowing of the left main pulmonary artery, related to extrinsic compression from the large left upper lobe mass. This mass also completely occludes several contributing branches to the left superior pulmonary vein. The mass sits immediately adjacent to the AP window, with evidence of early direct mediastinal invasion through the AP window. Small hiatal hernia. No axillary lymphadenopathy.  Lungs/Pleura: Large left upper lobe mass has significantly increased compared to the prior study, currently measuring 7.8 x 11.0 x 8.8 cm. This mass appears to be partially perfused by numerous collateral branches off the left internal mammary artery. No large hypertrophied bronchial arteries are confidently identified contributing to this mass, although several small bronchial arteries are noted, which do appear to extend into this mass. This mass makes a broad contact with the mediastinum, with evidence of direct invasion into the  AP window,  and comes in direct contact with the lateral aspect of the aortic arch, with complete obscuration of the intervening fat plane. Postobstructive changes are noted in the adjacent portions of the left upper lobe, and there is some nodular septal thickening, which suggests early lymphangitic spread of disease. Small left pleural effusion lying dependently, likely malignant. Right lung appears clear. Large Bochdalek's hernia on the right side.  Upper Abdomen: Moderate to severe right hydronephrosis, similar to recent PET-CT 12/21/2014. Small hypervascular lesion measuring 1.2 cm in the lateral aspect of the spleen, both of which appear to be new compared to the prior examination, concerning for potential metastatic lesions.  Musculoskeletal/Soft Tissues: There are no aggressive appearing lytic or blastic lesions noted in the visualized portions of the skeleton.  Review of the MIP images confirms the above findings.  IMPRESSION: 1. Enlarging left upper lobe mass which currently measures 7.8 x 11.0 x 8.8 cm, demonstrating evidence of direct mediastinal invasion, with multiple borderline enlarged and mildly enlarged bilateral mediastinal lymph nodes which are likely metastatic, small left pleural effusion which is likely malignant, and a new 1.2 cm hypervascular lesion in the spleen, suspicious for potential metastasis. 2. No hypertrophied bronchial arteries confidently identified to serve as suitable targets for potential embolization in this patient with history of hemoptysis. Much of the upper and anterior portion of the mass appears to be largely perfused by branches from the left internal mammary artery. 3. Moderate to severe right-hydroureteronephrosis again noted, similar to recent PET-CT. 4. Additional incidental findings, as above.   Electronically Signed   By: Vinnie Langton M.D.   On: 01/22/2015 15:02    Micro Results      Recent Results (from the past 240 hour(s))  MRSA PCR Screening     Status: None    Collection Time: 01/22/15  1:20 AM  Result Value Ref Range Status   MRSA by PCR NEGATIVE NEGATIVE Final    Comment:        The GeneXpert MRSA Assay (FDA approved for NASAL specimens only), is one component of a comprehensive MRSA colonization surveillance program. It is not intended to diagnose MRSA infection nor to guide or monitor treatment for MRSA infections.        Today   Subjective    Lindsey Fleming today has no headache,no chest abdominal pain,no new weakness tingling or numbness, feels much better wants to go home today.    Objective   Blood pressure 104/41, pulse 74, temperature 98 F (36.7 C), temperature source Oral, resp. rate 34, height '5\' 5"'$  (1.651 m), weight 71.2 kg (156 lb 15.5 oz), SpO2 94 %.   Intake/Output Summary (Last 24 hours) at 01/24/15 1246 Last data filed at 01/24/15 0800  Gross per 24 hour  Intake    540 ml  Output   1600 ml  Net  -1060 ml    Exam Awake Alert, Oriented x 3, No new F.N deficits, Normal affect [place].AT,PERRAL Supple Neck,No JVD, No cervical lymphadenopathy appriciated.  Symmetrical Chest wall movement, Good air movement bilaterally, CTAB RRR,No Gallops,Rubs or new Murmurs, No Parasternal Heave +ve B.Sounds, Abd Soft, Non tender, No organomegaly appriciated, No rebound -guarding or rigidity. No Cyanosis, Clubbing or edema, No new Rash or bruise   Data Review   CBC w Diff:  Lab Results  Component Value Date   WBC 8.8 01/23/2015   WBC 9.7 12/24/2014   HGB 9.7* 01/23/2015   HGB 10.6* 12/24/2014   HCT 30.9* 01/23/2015   HCT  33.5* 12/24/2014   PLT 352 01/23/2015   PLT 408* 12/24/2014   LYMPHOPCT 20.7 12/24/2014   MONOPCT 9.6 12/24/2014   EOSPCT 2.8 12/24/2014   BASOPCT 0.1 12/24/2014    CMP:  Lab Results  Component Value Date   NA 137 01/23/2015   NA 137 12/24/2014   K 3.6 01/23/2015   K 4.8 12/24/2014   CL 107 01/23/2015   CO2 24 01/23/2015   CO2 29 12/24/2014   BUN 7 01/23/2015   BUN 13.0 12/24/2014    CREATININE 0.51 01/23/2015   CREATININE 0.7 12/24/2014   PROT 7.3 12/24/2014   ALBUMIN 3.1* 12/24/2014   BILITOT 0.24 12/24/2014   ALKPHOS 116 12/24/2014   AST 10 12/24/2014   ALT 11 12/24/2014  .   Total Time in preparing paper work, data evaluation and todays exam - 35 minutes  Thurnell Lose M.D on 01/24/2015 at 12:46 PM  [facility]   Office  [phone]

## 2015-01-24 NOTE — Clinical Social Work Note (Signed)
CSW Consult Acknowledged:   CSW received a consult for financial assistance. CSW met the pt and her family at the bedside. CSW provided the family with contact information for [place] DSS and how to complete the Medicaid application. CSW will sign off.    Dysheka Bibbs, MSW, LCSWA [phone]  

## 2015-01-24 NOTE — Progress Notes (Signed)
Family wanting to communicate with Dr. Tammi Klippel before discharging. Dr. Tammi Klippel paged and spoke with family via phone. All questions answered and appointment made with Dr. Tammi Klippel.

## 2015-01-24 NOTE — Progress Notes (Signed)
Discussed discharge instructions and medications with patient and patients daughter. Both verbalized understanding with all questions answered. VSS. Follow up appointment made with Dr. Tyler Pita. Gave directions to make follow up apt with family doc within 1 week.  Pt discharged home with daughter.  [facility]

## 2015-01-24 NOTE — Progress Notes (Addendum)
PULMONARY / CRITICAL CARE MEDICINE   Name: Lindsey Fleming MRN: [id number] DOB: [birth year]    ADMISSION DATE:  01/21/2015 CONSULTATION DATE:  01/22/2015   REFERRING MD :  Orson Eva  CHIEF COMPLAINT:  Submassive hemoptysis with left upper lobe lung mass. Has undergone 2 biopsies with no diagnosis. Now presenting with submassive hemoptysis. Unable to embolize per IR. PCCM consulted for further management.   INTERVAL: Cough improved over night and this AM as well as hemoptysis, slept better last night. IR could not find artery to embolize on CT scan, so she is not a candidate for this.   STUDIES: CTA chest 9/6 > Enlarging left upper lobe mass which currently measures 7.8 x 11.0 x 8.8 cm, demonstrating evidence of direct mediastinal invasion. Bilateral mediastinal lymph node enlargement. New small left pleural effusion. New 1.2 cm hypervascular lesion in the spleen concerning for mets.   VITAL SIGNS: Temp:  [97.4 F (36.3 C)-98.3 F (36.8 C)] 97.8 F (36.6 C) (09/08 0800) Pulse Rate:  [74-123] 109 (09/08 0800) Resp:  [20-30] 23 (09/08 0800) BP: (101-135)/(45-76) 135/76 mmHg (09/08 0800) SpO2:  [91 %-97 %] 95 % (09/08 0800) HEMODYNAMICS:   VENTILATOR SETTINGS:   INTAKE / OUTPUT:  Intake/Output Summary (Last 24 hours) at 01/24/15 1033 Last data filed at 01/24/15 0340  Gross per 24 hour  Intake    640 ml  Output   1125 ml  Net   -485 ml    PHYSICAL EXAMINATION: General:  Frail elderly female sitting comfortably in the bed Neuro:  alert and oriented 3. Speech is normal HEENT:  neck is supple no neck nodes  Cardiovascular:  regular rate and rhythm no murmurs Lungs:  clear to auscultation bilaterally  Abdomen:  soft nontender no organomegaly  Musculoskeletal:  no cyanosis no clubbing no edema Skin:  Intact  LABS:  CBC  Recent Labs Lab 01/22/15 0252 01/22/15 1825 01/23/15 0231  WBC 9.4 10.6* 8.8  HGB 10.0* 11.0* 9.7*  HCT 32.1* 33.9* 30.9*  PLT 391 402* 352    Coag's  Recent Labs Lab 01/22/15 0940  APTT 30  INR 1.13   BMET  Recent Labs Lab 01/21/15 2019 01/22/15 0252 01/23/15 0231  NA 135 138 137  K 4.5 4.2 3.6  CL 101 105 107  CO2 '26 26 24  '$ BUN '14 11 7  '$ CREATININE 0.77 0.62 0.51  GLUCOSE 113* 134* 117*   Electrolytes  Recent Labs Lab 01/21/15 2019 01/22/15 0252 01/23/15 0231  CALCIUM 8.9 8.5* 8.2*   Sepsis Markers  Recent Labs Lab 01/21/15 2115  LATICACIDVEN 1.55   ABG No results for input(s): PHART, PCO2ART, PO2ART in the last 168 hours. Liver Enzymes No results for input(s): AST, ALT, ALKPHOS, BILITOT, ALBUMIN in the last 168 hours. Cardiac Enzymes No results for input(s): TROPONINI, PROBNP in the last 168 hours. Glucose No results for input(s): GLUCAP in the last 168 hours.  Imaging No results found.   ASSESSMENT / PLAN:  PULMONARY  A  Large left upper lobe 9.6 cm mass in the chest wall and mediastinal invasion - based on PET scan August 2016. Enlarging based on CT 9/6 with likely new mets to spleen.  - Nondiagnostic left upper lobe transbronchial biopsy 12/04/2014  - Nondiagnostic left upper lobe CT-guided transthoracic biopsy 01/01/2015 Submassive hemoptysis - Daughter and patient unsure of future direction of therapy. Patient at this general tiredness with repeated biopsy. New small L pleural effusion. Pocket not big enough for thora by bedside U/S.  P:   Unable to embolize via IR Hemoptysis/cough resolved. Has tiny streaks of old dried blood today.  Family is still uncertain about another diagnostic procedure.  Discussions on way with Palliative care. Will also discuss with radiation oncology if they would offer XRT without a tissue diagnosis. Add Robitussin + Codeine for cough supression . OK to be discharged from a pulmonary perspective. Follow up already arranged with oncology and Dr Elsworth Soho (pulmonary).  Marshell Garfinkel MD [facility] Pager [phone] If no  answer or after 3pm call: [phone] 01/24/2015, 10:39 AM

## 2015-01-24 NOTE — Discharge Instructions (Signed)
Follow with Primary MD Simona Huh, MD in 7 days   Get CBC, CMP, 2 view Chest X ray checked  by Primary MD next visit.    Activity: As tolerated with Full fall precautions use walker/cane & assistance as needed   Disposition Home     Diet: Heart Healthy    For Heart failure patients - Check your Weight same time everyday, if you gain over 2 pounds, or you develop in leg swelling, experience more shortness of breath or chest pain, call your Primary MD immediately. Follow Cardiac Low Salt Diet and 1.5 lit/day fluid restriction.   On your next visit with your primary care physician please Get Medicines reviewed and adjusted.   Please request your Prim.MD to go over all Hospital Tests and Procedure/Radiological results at the follow up, please get all Hospital records sent to your Prim MD by signing hospital release before you go home.   If you experience worsening of your admission symptoms, develop shortness of breath, life threatening emergency, suicidal or homicidal thoughts you must seek medical attention immediately by calling 911 or calling your MD immediately  if symptoms less severe.  You Must read complete instructions/literature along with all the possible adverse reactions/side effects for all the Medicines you take and that have been prescribed to you. Take any new Medicines after you have completely understood and accpet all the possible adverse reactions/side effects.   Do not drive, operating heavy machinery, perform activities at heights, swimming or participation in water activities or provide baby sitting services if your were admitted for syncope or siezures until you have seen by Primary MD or a Neurologist and advised to do so again.  Do not drive when taking Pain medications.    Do not take more than prescribed Pain, Sleep and Anxiety Medications  Special Instructions: If you have smoked or chewed Tobacco  in the last 2 yrs please stop smoking, stop any  regular Alcohol  and or any Recreational drug use.  Wear Seat belts while driving.   Please note  You were cared for by a hospitalist during your hospital stay. If you have any questions about your discharge medications or the care you received while you were in the hospital after you are discharged, you can call the unit and asked to speak with the hospitalist on call if the hospitalist that took care of you is not available. Once you are discharged, your primary care physician will handle any further medical issues. Please note that NO REFILLS for any discharge medications will be authorized once you are discharged, as it is imperative that you return to your primary care physician (or establish a relationship with a primary care physician if you do not have one) for your aftercare needs so that they can reassess your need for medications and monitor your lab values.

## 2015-01-24 NOTE — Consult Note (Signed)
Consultation Note Date: 01/24/2015   Patient Name: Lindsey Fleming  DOB: Mar 27, 1928  MRN: [id number]  Age / Sex: 81 y.o., female   PCP: Gaynelle Arabian, MD Referring Physician: Thurnell Lose, MD  Reason for Consultation: Establishing goals of care  Palliative Care Assessment and Plan Summary of Established Goals of Care and Medical Treatment Preferences    Palliative Care Discussion Held Today:   I met today with Lindsey Fleming and her daughter Lindsey Fleming (and Carol's husband) and her son Lindsey Fleming who lives with her. We had a long discussion as far as the biopsies that have been done and why they have not given the results we hoped and that their is no guarantee another biopsy would not be the same results. This is frustrating to the patient and after explaining I asked if she understood and she says "not really." We discussed the decision of moving forward  With another biopsy vs symptom management and focus on quality of life without aggressive intervention. Ms. Cogan is very overwhelmed and is unable to answer any of my questions - even what her fears/hopes are she always answers "I don't know."   Lindsey Fleming was tearful in explaining that she did not want her mother to suffer and go through a lot that may cause her pain/suffering. Lindsey Fleming leans more to focus on QOL. However, son Lindsey Fleming makes multiple comments indicated he wishes for another biopsy. Ms. Igo does not give her opinion. I even asked if she has a physician/provider that she would like their opinion to help her decide what is best with her but she cannot think of anyone. All she says is that she wants "whatever is best for me." I doubt that she will come to a conclusion here in the hospital.  She does not complain of many symptoms except hemoptysis which is getting better for now. Generalized fatigue, no pain, appetite is per her normal, some gradual weight loss over the last year. She is still fairly functional in able to prepare basic  meals and get around home fine although unable to get out and go to church as she used too (just doesn't feel "up to it"). Encouraged her that sometimes if people are feeling pretty good sometimes they do not want to take the risk of invasive testing and aggressive treatment and risk loosing their functional status/appetite etc. I do think it will be helpful for them to hear from Dr. Tammi Klippel as far as options from him in hopes of controlling her hemoptysis better. She does have a fear of radiation therapy as her sister had lymphoma and complications from radiation therapy.   We also discussed some concerns as far as finances as she is on a very limited income - suggested to look into Medicaid eligibility if needed - consult CSW to assist with information for family. Also provided with Living Will - they have discussed resuscitation and have mixed feelings - she has not made a complete decision. Also gave Hard Choices booklet. I will recommend outpatient palliative care to follow as she is struggling greatly with ALL decisions. I will follow along while hospitalized.    Contacts/Participants in Discussion: Primary Decision Maker: Self and then her children (daughter Lindsey Fleming is retired Therapist, sports)  Goals of [place]:   Code Status: FULL   Patient's goals are unclear. Daughter wants her comfortable and not to suffer but also to treat the treatable, optimize health otherwise. [place] would [place]. Ms. Tallman enjoys gardening and fishing.    Symptom Management:   Hemoptysis: Is better but will await input from radiation oncology.   Psycho-social/Spiritual:   Support System: Family is at bedside and very supportive. She has 3 children.   Desire for further Chaplaincy support: no  Prognosis: Very likely < 6 months although I did not discuss this with pt or family  Discharge Planning:  Home with [place] and palliative  care services       Chief Complaint/HPI: 81 yo female with PMH cataract and hernia repair. Has been in process of evaluation of LUL lung mass suspicious for advanced NSCLC and this admission new lesion found on spleen on CT. Admitted with hemoptysis. She has had 2 non diagnostic biopsies and they are unsure if they want to attempt a 3rd. Will explore other options as far as symptom management while they consider their options.   Primary Diagnoses  Present on Admission:  . Hemoptysis . Atrial fibrillation with RVR . Cavitating mass in left upper lung lobe . Mediastinal lymphadenopathy  Palliative Review of Systems:   + fatigue, + improving hemoptysis   I have reviewed the medical record, interviewed the patient and family, and examined the patient. The following aspects are pertinent.  Past Medical History  Diagnosis Date  . Cataract     bilateral  . H/O hernia repair    Social History   Social History  . Marital Status: Married    Spouse Name: N/A  . Number of Children: N/A  . Years of Education: N/A   Social History Main Topics  . Smoking status: Former Smoker -- 0.50 packs/day for 10 years    Quit date: 11/27/1972  . Smokeless tobacco: Never Used  . Alcohol Use: No  . Drug Use: No  . Sexual Activity: Not Asked   Other Topics Concern  . None   Social History Narrative   Family History  Problem Relation Age of Onset  . Stroke Father   . Cancer Sister   . Cancer Brother    Scheduled Meds: . metoprolol tartrate  50 mg Oral BID  . sodium chloride  3 mL Intravenous Q12H   Continuous Infusions:  PRN Meds:.acetaminophen, LORazepam Medications Prior to Admission:  Prior to Admission medications   Medication Sig Start Date End Date Taking? Authorizing Provider  acetaminophen (TYLENOL) 650 MG CR tablet Take 650 mg by mouth every 8 (eight) hours as needed for pain.   Yes Historical Provider, MD  LORazepam (ATIVAN) 0.5 MG tablet Take 0.5 mg by mouth 2 (two) times  daily as needed for anxiety.  11/30/14  Yes Historical Provider, MD  Multiple Vitamins-Minerals (CENTRUM ADULTS PO) Take 1 each by mouth daily.   Yes Historical Provider, MD  Phenyleph-Doxylamine-DM-APAP (NYQUIL SEVERE COLD/FLU) 5-6.25-10-325 MG/15ML LIQD Take 30 mLs by mouth at bedtime as needed (COUGH).   Yes Historical Provider, MD  metoprolol (LOPRESSOR) 50 MG tablet Take 1 tablet (50 mg total) by mouth 2 (two) times daily. 01/24/15   Thurnell Lose, MD   No Known Allergies CBC:    Component Value Date/Time   WBC 8.8 01/23/2015 0231   WBC 9.7 12/24/2014 1450   HGB 9.7* 01/23/2015 0231   HGB 10.6* 12/24/2014 1450   HCT 30.9* 01/23/2015 0231   HCT 33.5* 12/24/2014 1450   PLT 352 01/23/2015 0231   PLT 408* 12/24/2014 1450   MCV 88.0 01/23/2015 0231   MCV 88.4 12/24/2014 1450   NEUTROABS  6.5 12/24/2014 1450   LYMPHSABS 2.0 12/24/2014 1450   MONOABS 0.9 12/24/2014 1450   EOSABS 0.3 12/24/2014 1450   BASOSABS 0.0 12/24/2014 1450   Comprehensive Metabolic Panel:    Component Value Date/Time   NA 137 01/23/2015 0231   NA 137 12/24/2014 1450   K 3.6 01/23/2015 0231   K 4.8 12/24/2014 1450   CL 107 01/23/2015 0231   CO2 24 01/23/2015 0231   CO2 29 12/24/2014 1450   BUN 7 01/23/2015 0231   BUN 13.0 12/24/2014 1450   CREATININE 0.51 01/23/2015 0231   CREATININE 0.7 12/24/2014 1450   GLUCOSE 117* 01/23/2015 0231   GLUCOSE 95 12/24/2014 1450   CALCIUM 8.2* 01/23/2015 0231   CALCIUM 9.2 12/24/2014 1450   AST 10 12/24/2014 1450   ALT 11 12/24/2014 1450   ALKPHOS 116 12/24/2014 1450   BILITOT 0.24 12/24/2014 1450   PROT 7.3 12/24/2014 1450   ALBUMIN 3.1* 12/24/2014 1450    Physical Exam:  Vital Signs: BP 113/57 mmHg  Pulse 125  Temp(Src) 97.8 F (36.6 C) (Oral)  Resp 23  Ht 5' 5"  (1.651 m)  Wt 71.2 kg (156 lb 15.5 oz)  BMI 26.12 kg/m2  SpO2 95% SpO2: SpO2: 95 % O2 Device: O2 Device: Not Delivered O2 Flow Rate:   Intake/output summary:  Intake/Output Summary (Last  24 hours) at 01/24/15 1108 Last data filed at 01/24/15 0340  Gross per 24 hour  Intake    590 ml  Output   1125 ml  Net   -535 ml   Baseline Weight: Weight: 71.2 kg (156 lb 15.5 oz) Most recent weight: Weight: 71.2 kg (156 lb 15.5 oz)  Exam Findings:   General: NAD, lying in bed HEENT: [place]/AT CVS: Tachycardic Resp: No labored breathing Neuro: Awake, alert, oriented x 3 although very overwhelmed           Palliative Performance Scale: 50 %                Additional Data Reviewed: Recent Labs     01/22/15  0252  01/22/15  1825  01/23/15  0231  WBC  9.4  10.6*  8.8  HGB  10.0*  11.0*  9.7*  PLT  391  402*  352  NA  138   --   137  BUN  11   --   7  CREATININE  0.62   --   0.51     Time In: 1000 Time Out: 1120 Time Total: 45mn  Greater than 50%  of this time was spent counseling and coordinating care related to the above assessment and plan.   Signed by:  AVinie Sill NP Palliative Medicine Team Pager # [phone](M-F 8a-5p) Team Phone # [phone](Nights/Weekends)

## 2015-01-25 ENCOUNTER — Encounter: Payer: Self-pay | Admitting: Radiation Oncology

## 2015-01-25 ENCOUNTER — Ambulatory Visit
Admit: 2015-01-25 | Discharge: 2015-01-25 | Disposition: A | Discharge status: Home / Self Care | Payer: Medicare Other | Attending: Radiation Oncology | Admitting: Radiation Oncology

## 2015-01-25 VITALS — BP 112/61 | HR 113 | Resp 20 | Ht 65.0 in | Wt 154.6 lb

## 2015-01-25 DIAGNOSIS — D381 Neoplasm of uncertain behavior of trachea, bronchus and lung: Secondary | ICD-10-CM

## 2015-01-25 DIAGNOSIS — Z809 Family history of malignant neoplasm, unspecified: Secondary | ICD-10-CM | POA: Insufficient documentation

## 2015-01-25 DIAGNOSIS — Z510 Encounter for antineoplastic radiation therapy: Secondary | ICD-10-CM | POA: Insufficient documentation

## 2015-01-25 DIAGNOSIS — C3412 Malignant neoplasm of upper lobe, left bronchus or lung: Secondary | ICD-10-CM | POA: Diagnosis present

## 2015-01-25 NOTE — Progress Notes (Signed)
Thoracic Location of Tumor / Histology: a large left upper lobe mass consistent with stage III non small cell lung cancer  Patient presented January 2016 months ago with symptoms of: small amount of streaky occasional hemoptysis   The recent CT guided core biopsy of the left upper lobe lung mass in addition to the previous bronchoscopy and biopsy showed no malignant cells.  Tobacco/Marijuana/Snuff/ETOH use: quit smoking 42 years ago  Past/Anticipated interventions by cardiothoracic surgery, if any: none  Past/Anticipated interventions by medical oncology, if any: Evaluated by Dr. Julien Nordmann but, no prior therapy and current therapy prescribed.  Signs/Symptoms  Weight changes, if any: no  Respiratory complaints, if any: mild cough  Hemoptysis, if any: yes, has resolved  Pain issues, if any:  no  SAFETY ISSUES:  Prior radiation? no  Pacemaker/ICD? no   Possible current pregnancy?no  Is the patient on methotrexate? no  Current Complaints / other details:  81 year old female. NKDA.

## 2015-01-25 NOTE — Progress Notes (Signed)
  Radiation Oncology         [phone] ________________________________  Name: ZAKARIAH DEJARNETTE MRN: [id number]  Date: 01/25/2015  DOB: 11-29-1927  SIMULATION AND TREATMENT PLANNING NOTE    ICD-9-CM ICD-10-CM   1. Cancer of upper lobe of left lung without tissue diagnosis 162.3 C34.12     DIAGNOSIS:  81 yo woman with what appears to be stage T3N2 primary left upper lung cancer without tissue confirmation.  NARRATIVE:  The patient was brought to the [facility].  Identity was confirmed.  All relevant records and images related to the planned course of therapy were reviewed.  The patient freely provided informed written consent to proceed with treatment after reviewing the details related to the planned course of therapy. The consent form was witnessed and verified by the simulation staff.  Then, the patient was set-up in a stable reproducible  supine position for radiation therapy.  CT images were obtained.  Surface markings were placed.  The CT images were loaded into the planning software.  Then the target and avoidance structures were contoured.  Treatment planning then occurred.  The radiation prescription was entered and confirmed.  Then, I designed and supervised the construction of a total of 6 medically necessary complex treatment devices, including a BodyFix immobilization mold custom fitted to the patient along with 5 multileaf collimators conformally shaped radiation around the treatment target while shielding critical structures such as the heart and spinal cord maximally.  I have requested : 3D Simulation  I have requested a DVH of the following structures: Left lung, right lung, spinal cord, heart, esophagus, and target.  I have ordered:Nutrition Consult  PLAN:  The patient will receive 30 Gy in 10 fractions, then re-evaluate for further treatment if appropriate.  This document serves as a record of services personally performed by Tyler Pita, MD. It was created  on his behalf by Arlyce Harman, a trained medical scribe. The creation of this record is based on the scribe's personal observations and the provider's statements to them. This document has been checked and approved by the attending provider.    ________________________________  Sheral Apley. Tammi Klippel, M.D.

## 2015-01-25 NOTE — Progress Notes (Signed)
See progress note under physician encounter. 

## 2015-01-25 NOTE — Progress Notes (Signed)
Radiation Oncology         ([phone]) [phone] ________________________________  Initial outpatient Consultation  Name: Lindsey SHEAFFER MRN: [id number]  Date: 01/25/2015  DOB: Jun 23, 1927  UY:[id number],VZDGLO R, MD  Curt Bears, MD   REFERRING PHYSICIAN: Curt Bears, MD  DIAGNOSIS: The encounter diagnosis was Cancer of upper lobe of left lung.    ICD-9-CM ICD-10-CM   1. Cancer of upper lobe of left lung 162.3 C34.12 CT Biopsy    HISTORY OF PRESENT ILLNESS::Lindsey Fleming is a very nice 81 y.o. female  with no significant past medical history and remote history of smoking for around 10 years but quit in 1974 who was evaluated by her primary care physician Dr. Marisue Humble in January 2016 for complaining of chest tightness as well as cough with occasional hemoptysis. She also had some weight loss at that time. Chest x-ray performed on 06/13/2014 showed large irregular left upper lobe mass probably extending into the mediastinum consistent with carcinoma of the lung. This was followed by CT scan of the chest on 06/19/2014 and it showed a partially necrotic 9.9 x 8.7 x 5.7 cm spiculated mass in the medial aspect of the left upper lobe. The mass invades the anterior and middle mediastinum. The fat planes between the mass and the aortic arch and the aortopulmonary window and between the mass and the left main pulmonary artery are obliterated. The mass encases the bronchus to the superior aspect of the leftupper lobe and abuts the bronchus to the lingula of the left upper lobe. There is slight peripheral nodularity in the left lung apex which probably represents tumor. There is a subcarinal nodal mass 16 x 21 x 38 mm. There is an 8 mm node in the anterior mediastinum which is probably metastatic disease. There is several small nodes in the azygos region which are indeterminate in etiology.  The patient was referred to Dr. Julien Nordmann for evaluation of her condition. She developed fatigue as well as  shortness breath with exertion and occasional hemoptysis.  The patient reportedly had difficulty accepting the findings on CT and this delayed her compliance with recommended work up.  The patient underwent bronchoscopy on 12/04/14 which revealed benign findings and was non diagnostic. She underwent PET on 12/31/14, this demonstrated that the left upper lung mass measuring 9.6 cm shows central necrosis and hypermetabolism with a max SUV of 20.3. Mildly hypermetabolic lymph nodes were seen in the pre vascular and right peritracheal areas with max SUV of 3.7. There was no evidence of metastases in the neck, abdomen, or pelvis.   CT biopsy was performed on 01/01/15. The biopsy contained benign lung tissue presumably from post obstructive atelectatic lung. The CT biopsy tract may have sampled atelectatic lung rather than tumor    She has been referred today to discuss possible radiotherapy with or without further attempts at tissue diagnosis.  PREVIOUS RADIATION THERAPY: No  PAST MEDICAL HISTORY:  has a past medical history of Cataract; H/O hernia repair; and Lung cancer.    PAST SURGICAL HISTORY: Past Surgical History  Procedure Laterality Date  . Abdominal hysterectomy    . Video bronchoscopy Bilateral 12/04/2014    Procedure: VIDEO BRONCHOSCOPY WITH FLUORO;  Surgeon: Rigoberto Noel, MD;  Location: [facility];  Service: Cardiopulmonary;  Laterality: Bilateral;  . Cataract extraction Bilateral   . Bladder suspension    . Hernia repair      FAMILY HISTORY: family history includes Cancer in her brother and sister; Stroke in her father.  SOCIAL  HISTORY:  Social History   Social History  . Marital Status: Married    Spouse Name: N/A  . Number of Children: N/A  . Years of Education: N/A   Occupational History  . Not on file.   Social History Main Topics  . Smoking status: Former Smoker -- 0.50 packs/day for 10 years    Quit date: 11/27/1972  . Smokeless tobacco: Never Used  .  Alcohol Use: No  . Drug Use: No  . Sexual Activity: No   Other Topics Concern  . Not on file   Social History Narrative    ALLERGIES: Review of patient's allergies indicates no known allergies.  MEDICATIONS:  Current Outpatient Prescriptions  Medication Sig Dispense Refill  . acetaminophen (TYLENOL) 650 MG CR tablet Take 650 mg by mouth every 8 (eight) hours as needed for pain.    Marland Kitchen guaiFENesin-codeine 100-10 MG/5ML syrup Take 10 mLs by mouth every 4 (four) hours as needed for cough. 120 mL 0  . LORazepam (ATIVAN) 0.5 MG tablet Take 0.5 mg by mouth 2 (two) times daily as needed for anxiety.   0  . metoprolol (LOPRESSOR) 50 MG tablet Take 1 tablet (50 mg total) by mouth 2 (two) times daily. 60 tablet 0  . Multiple Vitamins-Minerals (CENTRUM ADULTS PO) Take 1 each by mouth daily.     No current facility-administered medications for this encounter.    REVIEW OF SYSTEMS:  A 15 point review of systems is documented in the electronic medical record. This was obtained by the nursing staff. However, I reviewed this with the patient to discuss relevant findings and make appropriate changes.  Pertinent items are noted in HPI.   PHYSICAL EXAM:  height is '5\' 5"'$  (1.651 m) and weight is 154 lb 9.6 oz (70.126 kg). Her blood pressure is 112/61 and her pulse is 113. Her respiration is 20 and oxygen saturation is 97%.    Per med-onc. General appearance: alert, cooperative and no distress Head: Normocephalic, without obvious abnormality, atraumatic Neck: no adenopathy, no JVD, supple, symmetrical, trachea midline and thyroid not enlarged, symmetric, no tenderness/mass/nodules Lymph nodes: Cervical, supraclavicular, and axillary nodes normal. Resp: clear to auscultation bilaterally Back: symmetric, no curvature. ROM normal. No CVA tenderness. Cardio: regular rate and rhythm, S1, S2 normal, no murmur, click, rub or gallop GI: soft, non-tender; bowel sounds normal; no masses,  no  organomegaly Extremities: extremities normal, atraumatic, no cyanosis or edema Neurologic: Alert and oriented X 3, normal strength and tone. Normal symmetric reflexes. Normal coordination and gait  KPS = 80  100 - Normal; no complaints; no evidence of disease. 90   - Able to carry on normal activity; minor signs or symptoms of disease. 80   - Normal activity with effort; some signs or symptoms of disease. 24   - Cares for self; unable to carry on normal activity or to do active work. 60   - Requires occasional assistance, but is able to care for most of his personal needs. 50   - Requires considerable assistance and frequent medical care. 82   - Disabled; requires special care and assistance. 94   - Severely disabled; hospital admission is indicated although death not imminent. 17   - Very sick; hospital admission necessary; active supportive treatment necessary. 10   - Moribund; fatal processes progressing rapidly. 0     - Dead  Karnofsky DA, Abelmann WH, Craver LS and [facility] [phone]) The use of the nitrogen mustards in the palliative treatment of carcinoma:  with particular reference to bronchogenic carcinoma Cancer 1 634-56  LABORATORY DATA:  Lab Results  Component Value Date   WBC 8.8 01/23/2015   HGB 9.7* 01/23/2015   HCT 30.9* 01/23/2015   MCV 88.0 01/23/2015   PLT 352 01/23/2015   Lab Results  Component Value Date   NA 137 01/23/2015   K 3.6 01/23/2015   CL 107 01/23/2015   CO2 24 01/23/2015   Lab Results  Component Value Date   ALT 11 12/24/2014   AST 10 12/24/2014   ALKPHOS 116 12/24/2014   BILITOT 0.24 12/24/2014     RADIOGRAPHY: Dg Chest 1 View  01/01/2015   CLINICAL DATA:  Status post left lung biopsy.  EXAM: CHEST  1 VIEW  COMPARISON:  Plain film of 12/04/2014 and the biopsy of earlier today. The PET of 12/21/2014.  FINDINGS: The trachea is displaced minimally to the right. Mild cardiomegaly. No pleural fluid. No pneumothorax. Left upper lobe lung mass, as  before. Increased density projecting over the right lung base is secondary to a fat containing right-sided Bochdalek's hernia, when correlated with recent PET.  IMPRESSION: No pneumothorax or other acute finding.  Left upper lobe lung mass, as before.   Electronically Signed   By: Abigail Miyamoto M.D.   On: 01/01/2015 14:09   Dg Chest 2 View  01/21/2015   CLINICAL DATA:  Productive cough for 3 days.  Coughing up blood.  EXAM: CHEST  2 VIEW  COMPARISON:  01/01/2015  FINDINGS: Mass lesion in the left upper lung/ suprahilar region medially, measuring 10.9 by 5.4 cm. This likely extends into the mediastinum as the trachea is displaced towards the left. Mild prominence of the right paratracheal region likely representing lymphadenopathy. Appearance is similar to prior study. There is infiltration or consolidation in the right lung base, similar to prior study. No new parenchymal changes. No blunting of costophrenic angles. Elevation of the right posterior hemidiaphragm. No pneumothorax. Degenerative changes in the spine.  IMPRESSION: Left upper lung mass and probable mediastinal lymphadenopathy. Atelectasis or consolidation in the right lung base. No change since previous study.   Electronically Signed   By: Lucienne Capers M.D.   On: 01/21/2015 21:31   Ct Biopsy  01/01/2015   CLINICAL DATA:  Left upper lobe lung mass with mediastinal invasion  EXAM: CT GUIDED CORE BIOPSY OF LEFT UPPER LOBE LUNG MASS  ANESTHESIA/SEDATION: Intravenous Fentanyl and Versed were administered as conscious sedation during continuous cardiorespiratory monitoring by the radiology RN, with a total moderate sedation time of four minutes.  PROCEDURE: The procedure risks, benefits, and alternatives were explained to the patient. Questions regarding the procedure were encouraged and answered. The patient understands and consents to the procedure.  Patient placed supine. Select axial scans through the thorax were obtained. The lesion was localized  and an appropriate skin entry site was determined and marked.  The operative field was prepped with Betadinein a sterile fashion, and a sterile drape was applied covering the operative field. A sterile gown and sterile gloves were used for the procedure. Local anesthesia was provided with 1% Lidocaine.  Under CT fluoroscopic guidance, a 17 gauge trocar needle was advanced to the margin of the lesion. Once needle tip position was confirmed, coaxial 18-gauge core biopsy samples were obtained, submitted in formalin to surgical pathology. The guide needle was removed. Postprocedure scans show no pneumothorax or significant regional alveolar hemorrhage.  COMPLICATIONS: None immediate  FINDINGS: Anterior left upper lobe mass with mediastinal invasion again localized. CT-guided  core biopsy samples were obtained.  IMPRESSION: 1. Technically successful CT-guided core biopsy of left upper lobe lung mass.   Electronically Signed   By: Lucrezia Europe M.D.   On: 01/01/2015 13:20   Ct Angio Chest Aorta W/cm &/or Wo/cm  01/22/2015   CLINICAL DATA:  81 year old female with history of lung cancer. Hemoptysis. Evaluate for potential hypertrophied bronchial artery to target intervention.  EXAM: CT ANGIOGRAPHY CHEST WITH CONTRAST  TECHNIQUE: Multidetector CT imaging of the chest was performed using the standard protocol during bolus administration of intravenous contrast. Multiplanar CT image reconstructions and MIPs were obtained to evaluate the vascular anatomy.  CONTRAST:  114m OMNIPAQUE IOHEXOL 350 MG/ML SOLN  COMPARISON:  Chest CT 06/19/2014.  FINDINGS: Mediastinum/Lymph Nodes: Heart size is normal. Old small amount of pericardial fluid and/or thickening, unlikely to be of hemodynamic significance at this time. No associated pericardial calcification. There is atherosclerosis of the thoracic aorta, the great vessels of the mediastinum and the coronary arteries, including calcified atherosclerotic plaque in the left main, left  anterior descending, left circumflex and right coronary arteries. Calcifications of the mitral annulus and mitral valve. Numerous borderline and mildly enlarged mediastinal lymph nodes measuring up to 11 mm in the subcarinal nodal station. Prominent nodal tissue in the left hilar region measuring up to 12 mm in thickness. Marked narrowing of the left main pulmonary artery, related to extrinsic compression from the large left upper lobe mass. This mass also completely occludes several contributing branches to the left superior pulmonary vein. The mass sits immediately adjacent to the AP window, with evidence of early direct mediastinal invasion through the AP window. Small hiatal hernia. No axillary lymphadenopathy.  Lungs/Pleura: Large left upper lobe mass has significantly increased compared to the prior study, currently measuring 7.8 x 11.0 x 8.8 cm. This mass appears to be partially perfused by numerous collateral branches off the left internal mammary artery. No large hypertrophied bronchial arteries are confidently identified contributing to this mass, although several small bronchial arteries are noted, which do appear to extend into this mass. This mass makes a broad contact with the mediastinum, with evidence of direct invasion into the AP window, and comes in direct contact with the lateral aspect of the aortic arch, with complete obscuration of the intervening fat plane. Postobstructive changes are noted in the adjacent portions of the left upper lobe, and there is some nodular septal thickening, which suggests early lymphangitic spread of disease. Small left pleural effusion lying dependently, likely malignant. Right lung appears clear. Large Bochdalek's hernia on the right side.  Upper Abdomen: Moderate to severe right hydronephrosis, similar to recent PET-CT 12/21/2014. Small hypervascular lesion measuring 1.2 cm in the lateral aspect of the spleen, both of which appear to be new compared to the prior  examination, concerning for potential metastatic lesions.  Musculoskeletal/Soft Tissues: There are no aggressive appearing lytic or blastic lesions noted in the visualized portions of the skeleton.  Review of the MIP images confirms the above findings.  IMPRESSION: 1. Enlarging left upper lobe mass which currently measures 7.8 x 11.0 x 8.8 cm, demonstrating evidence of direct mediastinal invasion, with multiple borderline enlarged and mildly enlarged bilateral mediastinal lymph nodes which are likely metastatic, small left pleural effusion which is likely malignant, and a new 1.2 cm hypervascular lesion in the spleen, suspicious for potential metastasis. 2. No hypertrophied bronchial arteries confidently identified to serve as suitable targets for potential embolization in this patient with history of hemoptysis. Much of the upper and  anterior portion of the mass appears to be largely perfused by branches from the left internal mammary artery. 3. Moderate to severe right-hydroureteronephrosis again noted, similar to recent PET-CT. 4. Additional incidental findings, as above.   Electronically Signed   By: Vinnie Langton M.D.   On: 01/22/2015 15:02      IMPRESSION: This is an 81 yo woman with presumed left upper lung non small cell lung cancer without tissue confirmation, despite multiple attempts who has been suffering with hemoptysis. Her protracted evaluation has lingered over several months and she has been hospitalized with hemoptysis. Ideally, I would recommend another attempt at tissue diagnosis. Given the fact that this patient has not smoked in over 40 years, she could potentially have a sub type of lung cancer which would respond to targeted therapy. However, given her recurrent bouts of hemoptysis and the family frustration over multiple non diagnostic procedures, empiric radiotherapy for hemostasis could be considered.  PLAN: Today, I talked to the patient and family about the findings and work-up  thus far.  We discussed the natural history of locally advanced lung cancer with hemoptysis and general treatment, highlighting the role of radiotherapy in the management.  We discussed the available radiation techniques, and focused on the details of logistics and delivery.  We reviewed the anticipated acute and late sequelae associated with radiation in this setting.  The patient was encouraged to ask questions that I answered to the best of my ability.     The patient would like to proceed with radiation planning and will be scheduled for CT simulation.  But, she is willing to undergo another attempt at CT-biopsy to confirm malignancy and determine histology/genomics.  I spent 60 minutes minutes face to face with the patient and more than 50% of that time was spent in counseling and/or coordination of care.     This document serves as a record of services personally performed by Tyler Pita, MD. It was created on his behalf by Arlyce Harman, a trained medical scribe. The creation of this record is based on the scribe's personal observations and the provider's statements to them. This document has been checked and approved by the attending provider.     ------------------------------------------------  Sheral Apley Tammi Klippel, M.D.

## 2015-01-27 DIAGNOSIS — Z510 Encounter for antineoplastic radiation therapy: Secondary | ICD-10-CM | POA: Diagnosis not present

## 2015-01-28 ENCOUNTER — Ambulatory Visit: Payer: Medicare Other | Admitting: Radiation Oncology

## 2015-01-29 ENCOUNTER — Ambulatory Visit: Payer: Medicare Other

## 2015-01-30 ENCOUNTER — Ambulatory Visit: Payer: Medicare Other

## 2015-01-31 ENCOUNTER — Ambulatory Visit: Payer: Medicare Other

## 2015-02-01 ENCOUNTER — Ambulatory Visit: Payer: Medicare Other

## 2015-02-04 ENCOUNTER — Ambulatory Visit: Payer: Medicare Other

## 2015-02-04 ENCOUNTER — Ambulatory Visit: Admission: RE | Admit: 2015-02-04 | Payer: Medicare Other | Source: Ambulatory Visit | Admitting: Radiation Oncology

## 2015-02-04 ENCOUNTER — Ambulatory Visit
Admission: RE | Admit: 2015-02-04 | Discharge: 2015-02-04 | Disposition: A | Discharge status: Home / Self Care | Payer: Medicare Other | Source: Ambulatory Visit | Attending: Radiation Oncology | Admitting: Radiation Oncology

## 2015-02-05 ENCOUNTER — Ambulatory Visit: Payer: Medicare Other

## 2015-02-05 ENCOUNTER — Inpatient Hospital Stay: Payer: Medicare Other | Admitting: Pulmonary Disease

## 2015-02-06 ENCOUNTER — Other Ambulatory Visit: Payer: Self-pay | Admitting: Radiology

## 2015-02-06 ENCOUNTER — Ambulatory Visit: Payer: Medicare Other

## 2015-02-07 ENCOUNTER — Ambulatory Visit: Payer: Medicare Other

## 2015-02-07 ENCOUNTER — Ambulatory Visit (HOSPITAL_COMMUNITY)
Admission: RE | Admit: 2015-02-07 | Discharge: 2015-02-07 | Disposition: A | Discharge status: Home / Self Care | Payer: Medicare Other | Source: Ambulatory Visit | Attending: Radiation Oncology | Admitting: Radiation Oncology

## 2015-02-07 ENCOUNTER — Encounter (HOSPITAL_COMMUNITY): Payer: Self-pay

## 2015-02-07 ENCOUNTER — Ambulatory Visit (HOSPITAL_COMMUNITY)
Admission: RE | Admit: 2015-02-07 | Discharge: 2015-02-07 | Disposition: A | Discharge status: Home / Self Care | Payer: Medicare Other | Source: Ambulatory Visit | Attending: Interventional Radiology | Admitting: Interventional Radiology

## 2015-02-07 DIAGNOSIS — R918 Other nonspecific abnormal finding of lung field: Secondary | ICD-10-CM | POA: Diagnosis present

## 2015-02-07 DIAGNOSIS — R042 Hemoptysis: Secondary | ICD-10-CM | POA: Insufficient documentation

## 2015-02-07 DIAGNOSIS — Z87891 Personal history of nicotine dependence: Secondary | ICD-10-CM | POA: Diagnosis not present

## 2015-02-07 DIAGNOSIS — C3412 Malignant neoplasm of upper lobe, left bronchus or lung: Secondary | ICD-10-CM | POA: Insufficient documentation

## 2015-02-07 DIAGNOSIS — Z9889 Other specified postprocedural states: Secondary | ICD-10-CM

## 2015-02-07 DIAGNOSIS — Z79899 Other long term (current) drug therapy: Secondary | ICD-10-CM | POA: Diagnosis not present

## 2015-02-07 LAB — PROTIME-INR
INR: 1.1 (ref 0.00–1.49)
PROTHROMBIN TIME: 14.4 s (ref 11.6–15.2)

## 2015-02-07 LAB — CBC
HCT: 35.5 % — ABNORMAL LOW (ref 36.0–46.0)
Hemoglobin: 11.5 g/dL — ABNORMAL LOW (ref 12.0–15.0)
MCH: 28.7 pg (ref 26.0–34.0)
MCHC: 32.4 g/dL (ref 30.0–36.0)
MCV: 88.5 fL (ref 78.0–100.0)
PLATELETS: 440 10*3/uL — AB (ref 150–400)
RBC: 4.01 MIL/uL (ref 3.87–5.11)
RDW: 15.7 % — AB (ref 11.5–15.5)
WBC: 9.8 10*3/uL (ref 4.0–10.5)

## 2015-02-07 LAB — APTT: aPTT: 28 seconds (ref 24–37)

## 2015-02-07 MED ORDER — FENTANYL CITRATE (PF) 100 MCG/2ML IJ SOLN
INTRAMUSCULAR | Status: AC
  Filled 2015-02-07: qty 2

## 2015-02-07 MED ORDER — MIDAZOLAM HCL 2 MG/2ML IJ SOLN
INTRAMUSCULAR | Status: AC
  Filled 2015-02-07: qty 2

## 2015-02-07 MED ORDER — SODIUM CHLORIDE 0.9 % IV SOLN: INTRAVENOUS | Status: DC

## 2015-02-07 MED ORDER — MIDAZOLAM HCL 2 MG/2ML IJ SOLN
INTRAMUSCULAR | Status: AC | PRN
  Administered 2015-02-07: 1 mg via INTRAVENOUS

## 2015-02-07 MED ORDER — LIDOCAINE HCL 1 % IJ SOLN
INTRAMUSCULAR | Status: AC
  Filled 2015-02-07: qty 20

## 2015-02-07 MED ORDER — FENTANYL CITRATE (PF) 100 MCG/2ML IJ SOLN
INTRAMUSCULAR | Status: AC | PRN
  Administered 2015-02-07: 25 ug via INTRAVENOUS

## 2015-02-07 NOTE — Procedures (Signed)
Interventional Radiology Procedure Note  Procedure: CT guided biopsy of left lung mass. 4 x 10V core Complications: None  Recommendations: - Bedrest until CXR cleared.  Minimize talking, coughing or otherwise straining.  - Follow up 1 hr CXR pending   Signed,  Jaime S. Earleen Newport, DO

## 2015-02-07 NOTE — Sedation Documentation (Signed)
Patient denies pain and is resting comfortably.  

## 2015-02-07 NOTE — H&P (Signed)
Chief Complaint: Patient was seen in consultation today for repeat L lung mass biopsy at the request of Manning,Matthew  Referring Physician(s): Manning,Matthew  History of Present Illness: Lindsey Fleming is a 81 y.o. female   Pt with continued cough and hemoptysis LUL bx 8/16: Lung, needle/core biopsy(ies), LUL - BENIGN LUNG PARENCHYMA WITH DENSE INTERSTITIAL FIBROSIS AND MIXED INFLAMMATION. - THERE IS NO EVIDENCE OF MALIGNANCY.  CTA 01/22/15: . Enlarging left upper lobe mass which currently measures 7.8 x 11.0 x 8.8 cm, demonstrating evidence of direct mediastinal invasion, with multiple borderline enlarged and mildly enlarged bilateral mediastinal lymph nodes which are likely metastatic, small left pleural effusion which is likely malignant, and a new 1.2 cm hypervascular lesion in the spleen, suspicious for potential metastasis. 2. No hypertrophied bronchial arteries confidently identified to serve as suitable targets for potential embolization in this patient with history of hemoptysis. Much of the upper and anterior portion of the mass appears to be largely perfused by branches from the left internal mammary artery.  Now request for additional sampling of LUL mass with Bx per Dr Tammi Klippel   Past Medical History  Diagnosis Date  . Cataract     bilateral  . H/O hernia repair   . Lung cancer     suspected stage III non small cell lung cancer     Past Surgical History  Procedure Laterality Date  . Abdominal hysterectomy    . Video bronchoscopy Bilateral 12/04/2014    Procedure: VIDEO BRONCHOSCOPY WITH FLUORO;  Surgeon: Rigoberto Noel, MD;  Location: [facility];  Service: Cardiopulmonary;  Laterality: Bilateral;  . Cataract extraction Bilateral   . Bladder suspension    . Hernia repair      Allergies: Review of patient's allergies indicates no known allergies.  Medications: Prior to Admission medications   Medication Sig Start Date End Date Taking?  Authorizing Provider  acetaminophen (TYLENOL) 650 MG CR tablet Take 650 mg by mouth every 8 (eight) hours as needed for pain.   Yes Historical Provider, MD  guaiFENesin-codeine 100-10 MG/5ML syrup Take 10 mLs by mouth every 4 (four) hours as needed for cough. 01/24/15  Yes Thurnell Lose, MD  LORazepam (ATIVAN) 0.5 MG tablet Take 0.5 mg by mouth 2 (two) times daily as needed for anxiety.  11/30/14  Yes Historical Provider, MD  metoprolol (LOPRESSOR) 50 MG tablet Take 1 tablet (50 mg total) by mouth 2 (two) times daily. 01/24/15  Yes Thurnell Lose, MD  Multiple Vitamins-Minerals (CENTRUM ADULTS PO) Take 1 each by mouth daily.   Yes Historical Provider, MD     Family History  Problem Relation Age of Onset  . Stroke Father   . Cancer Sister   . Cancer Brother     Social History   Social History  . Marital Status: Married    Spouse Name: N/A  . Number of Children: N/A  . Years of Education: N/A   Social History Main Topics  . Smoking status: Former Smoker -- 0.50 packs/day for 10 years    Quit date: 11/27/1972  . Smokeless tobacco: Never Used  . Alcohol Use: No  . Drug Use: No  . Sexual Activity: No   Other Topics Concern  . None   Social History Narrative     Review of Systems: A 12 point ROS discussed and pertinent positives are indicated in the HPI above.  All other systems are negative.  Review of Systems  Constitutional: Negative for fever and activity change.  Respiratory: Positive for cough and shortness of breath.        Hemoptysis  Neurological: Positive for weakness.  Psychiatric/Behavioral: Negative for behavioral problems and confusion.    Vital Signs: BP 138/81 mmHg  Pulse 102  Temp(Src) 98.6 F (37 C)  Resp 18  Ht '5\' 5"'$  (1.651 m)  Wt 157 lb (71.215 kg)  BMI 26.13 kg/m2  SpO2 100%  Physical Exam  Constitutional: She is oriented to person, place, and time.  Cardiovascular: Normal rate and regular rhythm.   Pulmonary/Chest: Effort normal and breath  sounds normal. She has no wheezes.  Abdominal: Soft. Bowel sounds are normal. There is no tenderness.  Musculoskeletal: Normal range of motion.  Neurological: She is alert and oriented to person, place, and time.  Skin: Skin is warm and dry.  Psychiatric: She has a normal mood and affect. Her behavior is normal. Judgment and thought content normal.  Nursing note and vitals reviewed.   Mallampati Score:  MD Evaluation Airway: WNL Heart: WNL Abdomen: WNL Chest/ Lungs: WNL ASA  Classification: 3 Mallampati/Airway Score: Two  Imaging: Dg Chest 2 View  01/21/2015   CLINICAL DATA:  Productive cough for 3 days.  Coughing up blood.  EXAM: CHEST  2 VIEW  COMPARISON:  01/01/2015  FINDINGS: Mass lesion in the left upper lung/ suprahilar region medially, measuring 10.9 by 5.4 cm. This likely extends into the mediastinum as the trachea is displaced towards the left. Mild prominence of the right paratracheal region likely representing lymphadenopathy. Appearance is similar to prior study. There is infiltration or consolidation in the right lung base, similar to prior study. No new parenchymal changes. No blunting of costophrenic angles. Elevation of the right posterior hemidiaphragm. No pneumothorax. Degenerative changes in the spine.  IMPRESSION: Left upper lung mass and probable mediastinal lymphadenopathy. Atelectasis or consolidation in the right lung base. No change since previous study.   Electronically Signed   By: Lucienne Capers M.D.   On: 01/21/2015 21:31   Ct Angio Chest Aorta W/cm &/or Wo/cm  01/22/2015   CLINICAL DATA:  81 year old female with history of lung cancer. Hemoptysis. Evaluate for potential hypertrophied bronchial artery to target intervention.  EXAM: CT ANGIOGRAPHY CHEST WITH CONTRAST  TECHNIQUE: Multidetector CT imaging of the chest was performed using the standard protocol during bolus administration of intravenous contrast. Multiplanar CT image reconstructions and MIPs were  obtained to evaluate the vascular anatomy.  CONTRAST:  160m OMNIPAQUE IOHEXOL 350 MG/ML SOLN  COMPARISON:  Chest CT 06/19/2014.  FINDINGS: Mediastinum/Lymph Nodes: Heart size is normal. Old small amount of pericardial fluid and/or thickening, unlikely to be of hemodynamic significance at this time. No associated pericardial calcification. There is atherosclerosis of the thoracic aorta, the great vessels of the mediastinum and the coronary arteries, including calcified atherosclerotic plaque in the left main, left anterior descending, left circumflex and right coronary arteries. Calcifications of the mitral annulus and mitral valve. Numerous borderline and mildly enlarged mediastinal lymph nodes measuring up to 11 mm in the subcarinal nodal station. Prominent nodal tissue in the left hilar region measuring up to 12 mm in thickness. Marked narrowing of the left main pulmonary artery, related to extrinsic compression from the large left upper lobe mass. This mass also completely occludes several contributing branches to the left superior pulmonary vein. The mass sits immediately adjacent to the AP window, with evidence of early direct mediastinal invasion through the AP window. Small hiatal hernia. No axillary lymphadenopathy.  Lungs/Pleura: Large left upper lobe mass has significantly  increased compared to the prior study, currently measuring 7.8 x 11.0 x 8.8 cm. This mass appears to be partially perfused by numerous collateral branches off the left internal mammary artery. No large hypertrophied bronchial arteries are confidently identified contributing to this mass, although several small bronchial arteries are noted, which do appear to extend into this mass. This mass makes a broad contact with the mediastinum, with evidence of direct invasion into the AP window, and comes in direct contact with the lateral aspect of the aortic arch, with complete obscuration of the intervening fat plane. Postobstructive changes  are noted in the adjacent portions of the left upper lobe, and there is some nodular septal thickening, which suggests early lymphangitic spread of disease. Small left pleural effusion lying dependently, likely malignant. Right lung appears clear. Large Bochdalek's hernia on the right side.  Upper Abdomen: Moderate to severe right hydronephrosis, similar to recent PET-CT 12/21/2014. Small hypervascular lesion measuring 1.2 cm in the lateral aspect of the spleen, both of which appear to be new compared to the prior examination, concerning for potential metastatic lesions.  Musculoskeletal/Soft Tissues: There are no aggressive appearing lytic or blastic lesions noted in the visualized portions of the skeleton.  Review of the MIP images confirms the above findings.  IMPRESSION: 1. Enlarging left upper lobe mass which currently measures 7.8 x 11.0 x 8.8 cm, demonstrating evidence of direct mediastinal invasion, with multiple borderline enlarged and mildly enlarged bilateral mediastinal lymph nodes which are likely metastatic, small left pleural effusion which is likely malignant, and a new 1.2 cm hypervascular lesion in the spleen, suspicious for potential metastasis. 2. No hypertrophied bronchial arteries confidently identified to serve as suitable targets for potential embolization in this patient with history of hemoptysis. Much of the upper and anterior portion of the mass appears to be largely perfused by branches from the left internal mammary artery. 3. Moderate to severe right-hydroureteronephrosis again noted, similar to recent PET-CT. 4. Additional incidental findings, as above.   Electronically Signed   By: Vinnie Langton M.D.   On: 01/22/2015 15:02    Labs:  CBC:  Recent Labs  01/22/15 0252 01/22/15 1825 01/23/15 0231 02/07/15 1024  WBC 9.4 10.6* 8.8 9.8  HGB 10.0* 11.0* 9.7* 11.5*  HCT 32.1* 33.9* 30.9* 35.5*  PLT 391 402* 352 440*    COAGS:  Recent Labs  01/01/15 0915  01/22/15 0940  INR 1.06 1.13  APTT 28 30    BMP:  Recent Labs  12/24/14 1450 01/21/15 2019 01/22/15 0252 01/23/15 0231  NA 137 135 138 137  K 4.8 4.5 4.2 3.6  CL  --  101 105 107  CO2 '29 26 26 24  '$ GLUCOSE 95 113* 134* 117*  BUN 13.0 '14 11 7  '$ CALCIUM 9.2 8.9 8.5* 8.2*  CREATININE 0.7 0.77 0.62 0.51  GFRNONAA  --  >60 >60 >60  GFRAA  --  >60 >60 >60    LIVER FUNCTION TESTS:  Recent Labs  11/28/14 1349 12/24/14 1450  BILITOT 0.25 0.24  AST 11 10  ALT 7 11  ALKPHOS 114 116  PROT 7.4 7.3  ALBUMIN 3.1* 3.1*    TUMOR MARKERS: No results for input(s): AFPTM, CEA, CA199, CHROMGRNA in the last 8760 hours.  Assessment and Plan:  LUL mass Enlarging since 01/01/15 original bx Bx was negative With enlarging mass and hemoptysis-- Repeat bx today Risks and Benefits discussed with the patient including, but not limited to bleeding, hemoptysis, respiratory failure requiring intubation, infection, pneumothorax requiring  chest tube placement, stroke from air embolism or even death. All of the patient's questions were answered, patient is agreeable to proceed. Consent signed and in chart.   Thank you for this interesting consult.  I greatly enjoyed meeting Lindsey Fleming and look forward to participating in their care.  A copy of this report was sent to the requesting provider on this date.  Signed: Arlynn Mcdermid A 02/07/2015, 10:50 AM   I spent a total of  30 Minutes   in face to face in clinical consultation, greater than 50% of which was counseling/coordinating care for repeat LUL mass bx

## 2015-02-07 NOTE — Discharge Instructions (Signed)
Needle Biopsy of Lung, Care After °Refer to this sheet in the next few weeks. These instructions provide you with information on caring for yourself after your procedure. Your health care provider may also give you more specific instructions. Your treatment has been planned according to current medical practices, but problems sometimes occur. Call your health care provider if you have any problems or questions after your procedure. °WHAT TO EXPECT AFTER THE PROCEDURE °· A bandage will be applied over the area where the needle was inserted. You may be asked to apply pressure to the bandage for several minutes to ensure there is minimal bleeding. °· In most cases, you can leave when your needle biopsy procedure is completed. Do not drive yourself home. Someone else should take you home. °· If you received an IV sedative or general anesthetic, you will be taken to a comfortable place to relax while the medicine wears off. °· If you have upcoming travel scheduled, talk to your health care provider about when it is safe to travel by air after the procedure. °HOME CARE INSTRUCTIONS °· Expect to take it easy for the rest of the day. °· Protect the area where you received the needle biopsy by keeping the bandage in place for as long as instructed. °· You may feel some mild pain or discomfort in the area, but this should stop in a day or two. °· Take medicines only as directed by your health care provider. °SEEK MEDICAL CARE IF:  °· You have pain at the biopsy site that worsens or is not helped by medicine. °· You have swelling or drainage at the needle biopsy site. °· You have a fever. °SEEK IMMEDIATE MEDICAL CARE IF:  °· You have new or worsening shortness of breath. °· You have chest pain. °· You are coughing up blood. °· You have bleeding that does not stop with pressure or a bandage. °· You develop light-headedness or fainting. °Document Released: 03/01/2007 Document Revised: 09/18/2013 Document Reviewed:  09/26/2012 °Exit[employer]® Patient Information ©2015 [employer]. This information is not intended to replace advice given to you by your health care provider. Make sure you discuss any questions you have with your health care provider. ° °

## 2015-02-08 ENCOUNTER — Ambulatory Visit: Payer: Medicare Other

## 2015-02-11 ENCOUNTER — Telehealth: Payer: Self-pay | Admitting: *Deleted

## 2015-02-11 ENCOUNTER — Ambulatory Visit: Payer: Medicare Other

## 2015-02-11 NOTE — Telephone Encounter (Signed)
Oncology Nurse Navigator Documentation  Oncology Nurse Navigator Flowsheets 02/11/2015  Navigator Encounter Type Telephone/I noticed that Ms. Foulk tissue came back + for squamous cell lung cancer.  Patient is scheduled for SIM this week.  I asked Dr. Julien Nordmann if patient needs to see him sooner that Nov appt.  He asked that I call patient or daughter to see about her wishes.  I called and spoke with the daughter.  She stated patient is still in shock about dx.  For now patient does not want chemotherapy.  I asked [name], the daughter to call me if she changes her mind.    Patient Visit Type Follow-up  Treatment Phase Abnormal Scans  Barriers/Navigation Needs -  Education -  Interventions Coordination of Care  Time Spent with Patient 30

## 2015-02-12 ENCOUNTER — Ambulatory Visit: Payer: Medicare Other

## 2015-02-12 ENCOUNTER — Ambulatory Visit
Admission: RE | Admit: 2015-02-12 | Discharge: 2015-02-12 | Disposition: A | Discharge status: Home / Self Care | Payer: Medicare Other | Source: Ambulatory Visit | Attending: Radiation Oncology | Admitting: Radiation Oncology

## 2015-02-12 ENCOUNTER — Ambulatory Visit: Payer: Medicare Other | Admitting: Radiation Oncology

## 2015-02-13 ENCOUNTER — Ambulatory Visit: Payer: Medicare Other

## 2015-02-13 ENCOUNTER — Ambulatory Visit
Admission: RE | Admit: 2015-02-13 | Discharge: 2015-02-13 | Disposition: A | Discharge status: Home / Self Care | Payer: Medicare Other | Source: Ambulatory Visit | Attending: Radiation Oncology | Admitting: Radiation Oncology

## 2015-02-13 DIAGNOSIS — Z510 Encounter for antineoplastic radiation therapy: Secondary | ICD-10-CM | POA: Diagnosis not present

## 2015-02-14 ENCOUNTER — Ambulatory Visit
Admission: RE | Admit: 2015-02-14 | Discharge: 2015-02-14 | Disposition: A | Discharge status: Home / Self Care | Payer: Medicare Other | Source: Ambulatory Visit | Attending: Radiation Oncology | Admitting: Radiation Oncology

## 2015-02-14 DIAGNOSIS — Z510 Encounter for antineoplastic radiation therapy: Secondary | ICD-10-CM | POA: Diagnosis not present

## 2015-02-15 ENCOUNTER — Ambulatory Visit
Admission: RE | Admit: 2015-02-15 | Discharge: 2015-02-15 | Disposition: A | Discharge status: Home / Self Care | Payer: Medicare Other | Source: Ambulatory Visit | Attending: Radiation Oncology | Admitting: Radiation Oncology

## 2015-02-15 ENCOUNTER — Ambulatory Visit: Payer: Medicare Other

## 2015-02-15 ENCOUNTER — Encounter: Payer: Self-pay | Admitting: Radiation Oncology

## 2015-02-15 VITALS — BP 102/63 | HR 76 | Resp 16 | Wt 155.3 lb

## 2015-02-15 DIAGNOSIS — Z87891 Personal history of nicotine dependence: Secondary | ICD-10-CM | POA: Insufficient documentation

## 2015-02-15 DIAGNOSIS — Z510 Encounter for antineoplastic radiation therapy: Secondary | ICD-10-CM | POA: Diagnosis not present

## 2015-02-15 DIAGNOSIS — C3412 Malignant neoplasm of upper lobe, left bronchus or lung: Secondary | ICD-10-CM

## 2015-02-15 MED ORDER — RADIAPLEXRX EX GEL
Freq: Once | CUTANEOUS | Status: AC
  Administered 2015-02-15: 15:00:00 via TOPICAL

## 2015-02-15 NOTE — Progress Notes (Signed)
  Radiation Oncology         [phone]   Name: [name] MRN: [id number]   Date: 02/15/2015  DOB: Jan 29, 1928   Weekly Radiation Therapy Management    ICD-9-CM ICD-10-CM   1. Cancer of upper lobe of left lung without tissue diagnosis 162.3 C34.12 hyaluronate sodium (RADIAPLEXRX) gel   Cancer of upper lobe of left lung without tissue diagnosis  Current Dose: 6 Gy  Planned Dose:  30 Gy  Narrative The patient presents for routine under treatment assessment. She has completed 2 out of 10 treatments.  The patient denies symptoms of pain. However, she reports an occasional brief sharp pain in her left uppermost side, a productive cough with clear sputum, and hemoptysis that resolved several weeks ago. She also reports SOB with exertion, so she breaths slowly through her mouth to quickly recover from the SOB. Hoarseness and difficulty speaking is observed. Mild difficulty swallowing and sore throat are also reported. She states that she is able to eat without food getting stuck. "Her arm goes to sleep a lot, more frequently," her friend stated about the patient's right arm. Head pain is also reported on the left back side of the head. Set-up films were reviewed. The chart was checked. The patient projected a healthy mental status and was accompanied by a friend.    Physical Findings  weight is 155 lb 4.8 oz (70.444 kg). Her blood pressure is 102/63 and her pulse is 76. Her respiration is 16 and oxygen saturation is 100%.  She is alert and oriented. Weight essentially stable. There is no significant changes to the status of overall health to be noted at this time.   Impression Lindsey Fleming is a 81 year old woman presenting to clinic in regards to her cancer of upper lobe of left lung without tissue diagnosis. The patient is tolerating radiation. She is managing reported symptoms appropriately. The patient was provided with instructions on use of Radiaplex cream. She understands the importance  of maintaining her nutrition throughout treatment.  The patient understands that she can access her appointments and medical records via [facility] [place].   Plan The patient has been advised to continue treatment as planned. Healthy methods of management in regards to reported symptoms were reviewed in detail. Radiaplex cream was provided to administer as needed. She is advised of her follow-up appointment with radiation oncology to take place next week, as scheduled.  All vocalized questions and concerns have been addressed. If the patient develops any further questions or concerns in regards to her treatment and recovery, she has been encouraged to contact Dr. Tammi Klippel, MD.   .    This document serves as a record of services personally performed by Tyler Pita, MD. It was created on his behalf by Lenn Cal, a trained medical scribe. The creation of this record is based on the scribe's personal observations and the Quenna Doepke's statements to them. This document has been checked and approved by the attending Aldrin Engelhard.  _________________________________________________   Sheral Apley. Tammi Klippel, M.D.

## 2015-02-15 NOTE — Progress Notes (Addendum)
Weight and vitals stable. Denies pain at this time. Reports occasional brief sharp pain in her left uppermost side. Reports a productive cough with clear sputum. Reports hemoptysis resolved several weeks ago. Reports SOB with exertion that feels like she is smothering at time. Reports she breaths slowly through her mouth to quickly recover from the SOB. Hoarseness noted. Reports mild difficulty swallowing and sore throat. Oriented patient to staff and routine of the clinic. Provided patient with RADIATION THERAPY AND YOU handbook then, reviewed pertinent information. Educated patient reference potential side effects and management such as, fatigue, skin changes, throat changes and cough. Provided patient with radiaplex gel and directed upon use. Afforded patient the opportunity to ask questions and answered those to the best of my ability. Patient verbalized understanding of all reviewed.   BP 102/63 mmHg  Pulse 76  Resp 16  Wt 155 lb 4.8 oz (70.444 kg)  SpO2 100% Wt Readings from Last 3 Encounters:  02/15/15 155 lb 4.8 oz (70.444 kg)  02/07/15 157 lb (71.215 kg)  01/25/15 154 lb 9.6 oz (70.126 kg)

## 2015-02-18 ENCOUNTER — Ambulatory Visit
Admission: RE | Admit: 2015-02-18 | Discharge: 2015-02-18 | Disposition: A | Discharge status: Home / Self Care | Payer: Medicare Other | Source: Ambulatory Visit | Attending: Radiation Oncology | Admitting: Radiation Oncology

## 2015-02-18 DIAGNOSIS — Z510 Encounter for antineoplastic radiation therapy: Secondary | ICD-10-CM | POA: Diagnosis not present

## 2015-02-19 ENCOUNTER — Ambulatory Visit
Admission: RE | Admit: 2015-02-19 | Discharge: 2015-02-19 | Disposition: A | Discharge status: Home / Self Care | Payer: Medicare Other | Source: Ambulatory Visit | Attending: Radiation Oncology | Admitting: Radiation Oncology

## 2015-02-19 ENCOUNTER — Encounter: Payer: Self-pay | Admitting: Radiation Oncology

## 2015-02-19 ENCOUNTER — Telehealth: Payer: Self-pay | Admitting: Radiation Oncology

## 2015-02-19 VITALS — BP 137/69 | HR 117 | Temp 98.1°F | Resp 20 | Wt 154.7 lb

## 2015-02-19 DIAGNOSIS — Z510 Encounter for antineoplastic radiation therapy: Secondary | ICD-10-CM | POA: Diagnosis not present

## 2015-02-19 DIAGNOSIS — C3412 Malignant neoplasm of upper lobe, left bronchus or lung: Secondary | ICD-10-CM

## 2015-02-19 NOTE — Progress Notes (Signed)
Weekly Management Note:  Site: Lung Current Dose:  900  cGy Projected Dose: 3000  cGy  Narrative: The patient is seen today for routine under treatment assessment. CBCT/MVCT images/port films were reviewed. The chart was reviewed.   The patient is seen today prior to treatment today after having had "sweats but no chills" and nausea yesterday evening.  She had a cough yesterday evening, but no cough this morning after taking cough syrup last night.  No history of fevers.  She also reports fatigue.  She is scheduled today for her third of 10 fractions of radiation therapy. Physical Examination:  Filed Vitals:   02/19/15 1246  BP: 137/69  Pulse: 117  Temp: 98.1 F (36.7 C)  Resp: 20  .  Weight: 154 lb 11.2 oz (70.171 kg).   O2 sat 99% on room air.    She does not appear to be acutely ill.  Chest: Lungs are clear.  Impression: It is possible that she may have had some type of "tumor lysis" prostate causing sweats.  I feel that she can move ahead with her radiation therapy as scheduled by Dr. Tammi Klippel.  Plan: Continue radiation therapy as planned.

## 2015-02-19 NOTE — Telephone Encounter (Signed)
Returned message left by Annye Rusk reference Deidre Ala. No answer at either number provided. Left message on both requesting return call.

## 2015-02-19 NOTE — Addendum Note (Signed)
Encounter addended by: Heywood Footman, RN on: 02/19/2015 11:19 AM<BR>     Documentation filed: Notes Section, Inpatient Patient Education, Chief Complaint Section

## 2015-02-19 NOTE — Progress Notes (Signed)
Here to be seen before treatment to Left Lung, has had 3 so far completyed, wanted to be seen before treatment, had chills last night and nausea, and coughing, phelmg hanging in throat, ate cerael and banans this am no nausea, no coughing, took cough syrup last night that helped. BP 137/69 mmHg  Pulse 117  Temp(Src) 98.1 F (36.7 C) (Oral)  Resp 20  Wt 154 lb 11.2 oz (70.171 kg)  SpO2 99%  Wt Readings from Last 3 Encounters:  02/19/15 154 lb 11.2 oz (70.171 kg)  02/15/15 155 lb 4.8 oz (70.444 kg)  02/07/15 157 lb (71.215 kg)   12:49 PM'

## 2015-02-20 ENCOUNTER — Ambulatory Visit
Admission: RE | Admit: 2015-02-20 | Discharge: 2015-02-20 | Disposition: A | Discharge status: Home / Self Care | Payer: Medicare Other | Source: Ambulatory Visit | Attending: Radiation Oncology | Admitting: Radiation Oncology

## 2015-02-20 DIAGNOSIS — Z510 Encounter for antineoplastic radiation therapy: Secondary | ICD-10-CM | POA: Diagnosis not present

## 2015-02-21 ENCOUNTER — Encounter: Payer: Self-pay | Admitting: *Deleted

## 2015-02-21 ENCOUNTER — Ambulatory Visit
Admission: RE | Admit: 2015-02-21 | Discharge: 2015-02-21 | Disposition: A | Discharge status: Home / Self Care | Payer: Medicare Other | Source: Ambulatory Visit | Attending: Radiation Oncology | Admitting: Radiation Oncology

## 2015-02-21 ENCOUNTER — Encounter: Payer: Self-pay | Admitting: Radiation Oncology

## 2015-02-21 VITALS — BP 102/65 | HR 115 | Resp 16 | Wt 157.0 lb

## 2015-02-21 DIAGNOSIS — Z510 Encounter for antineoplastic radiation therapy: Secondary | ICD-10-CM | POA: Diagnosis not present

## 2015-02-21 DIAGNOSIS — C3412 Malignant neoplasm of upper lobe, left bronchus or lung: Secondary | ICD-10-CM

## 2015-02-21 NOTE — Progress Notes (Signed)
Oncology Nurse Navigator Documentation  Oncology Nurse Navigator Flowsheets 02/21/2015  Navigator Encounter Type Treatment/spoke with patient today after her radiation tx.  I asked how she was feeling and she stated she was tired.  Her daughter [name] is with her today.   I asked how she felt about having chemotherapy.  She did not answer.  I listened as she and her daughter spoke of other issues relating to fatigue.  It seems she does not want to address chemotherapy at this time.  I will update Dr. Julien Nordmann   Patient Visit Type Radonc  Treatment Phase Treatment  Barriers/Navigation Needs -  Education -  Interventions Coordination of Care  Time Spent with Patient 15

## 2015-02-21 NOTE — Progress Notes (Signed)
Weight and vitals stable. Denies pain at this time. Reports occasional left face and chest pain. Reports her stomach often aches. Reports taking tylenol and ativan often because she "just doesn't feel well inside." Reports nausea comes and goes. Understands ativan can help relieve nausea. Reports her right hand often goes to sleep. Denies skin changes within treatment field. Reports occasional difficulty swallowing but, no pain. Reports fatigue. Hoarseness continues.   BP 102/65 mmHg  Pulse 115  Resp 16  Wt 157 lb (71.215 kg)  SpO2 100% Wt Readings from Last 3 Encounters:  02/21/15 157 lb (71.215 kg)  02/19/15 154 lb 11.2 oz (70.171 kg)  02/15/15 155 lb 4.8 oz (70.444 kg)

## 2015-02-21 NOTE — Progress Notes (Signed)
  Radiation Oncology         [id number]   Name: Lindsey Fleming MRN: [id number]   Date: 02/21/2015  DOB: January 02, 1928   Weekly Radiation Therapy Management    ICD-9-CM ICD-10-CM   1. 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 She has completed 6 out of 10 treatments  Her weight and vitals are currently stable. She denies symptoms of pain at this time. The patient reports occasional left face and chest pain and that her stomach often aches. She confirms taking tylenol and ativan often because she "just doesn't feel well inside." Her nausea "comes and goes" along as does her appetite. She also confirms that her right hand often goes to sleep and reports occasional difficulty swallowing but, no pain. Hoarseness of her voice continues. She confirms that she can swallow well. However, she become short winded and is very fatigued.  Set-up films were reviewed. The chart was checked. The patient projected a healthy mental status and was accompanied by her daughter.   Physical Findings  weight is 157 lb (71.215 kg). Her blood pressure is 102/65 and her pulse is 115. Her respiration is 16 and oxygen saturation is 100%.  She is alert and oriented. Weight essentially stable. There is no significant changes to the status of overall health to be noted at this time.   Impression Lindsey Fleming is a 81 year old woman presenting to clinic in regards to her cancer of upper lobe of left lung without tissue diagnosis. The patient is tolerating radiation. She is managing reported symptoms appropriately. The patient understands to continue the use of Radiaplex cream. She understands the importance of maintaining her nutrition throughout treatment. The patient understood her most recent scans, which were reviewed,  and that her lung is still collapsed. The patient was concerned about the horsiness of her voice and if this symptom would ever improve. Surgery is not recommended as a solution at this time.  The patient understands that she can access her appointments and medical records via [facility].   Plan The patient has been advised to continue treatment as planned. Healthy methods of management in regards to reported symptoms were reviewed in detail. Her most recent scans were explained to her. She is advised of her follow-up appointment with radiation oncology to take place next week, as scheduled. Ativan is recommended as a solution to alleviate her reported symptom of nausea. A diagnostic CT scan of the chest is to be complete once treatments are completed.  All vocalized questions and concerns have been addressed. If the patient develops any further questions or concerns in regards to her treatment and recovery, she has been encouraged to contact Dr. Tammi Klippel, MD.    This document serves as a record of services personally performed by Tyler Pita, MD. It was created on his behalf by Lenn Cal, a trained medical scribe. The creation of this record is based on the scribe's personal observations and the provider's statements to them. This document has been checked and approved by the attending provider.  _________________________________________________   Sheral Apley. Tammi Klippel, M.D.

## 2015-02-22 ENCOUNTER — Telehealth: Payer: Self-pay | Admitting: Radiation Oncology

## 2015-02-22 ENCOUNTER — Encounter: Payer: Self-pay | Admitting: Radiation Oncology

## 2015-02-22 ENCOUNTER — Ambulatory Visit
Admission: RE | Admit: 2015-02-22 | Discharge: 2015-02-22 | Disposition: A | Discharge status: Home / Self Care | Payer: Medicare Other | Source: Ambulatory Visit | Attending: Radiation Oncology | Admitting: Radiation Oncology

## 2015-02-22 DIAGNOSIS — Z510 Encounter for antineoplastic radiation therapy: Secondary | ICD-10-CM | POA: Diagnosis not present

## 2015-02-22 NOTE — Telephone Encounter (Signed)
Understand from [place], [place] the patient wishes to forego final three treatments due to fatigue and weakness. Thayer Headings, RN told the patient to present Monday and speak with Dr. Tammi Klippel before stopping. Phoned patient's home tonight to explain Dr. Tammi Klippel is not in the office Monday. Spoke with patient's daughter, [name]. [name] reports if her mother is feeling stronger by Monday they do plan to present at 1515 for treatment.

## 2015-02-22 NOTE — Progress Notes (Signed)
Patient and daughter came to nursing stating " she wants to stop any further treatments" "was up all night", infomred them Dr. Tammi Klippel was out of the office at present ' and would patient think this over and come back Monday to discuss if still wanting to stop radiation?, both patient and daughter said yes, will defer to Noland Fordyce when she comes in today to give patient a call later today. 10:51 AM

## 2015-02-22 NOTE — Telephone Encounter (Signed)
Understand from Thayer Headings, RN that the patient wishes to stop radiation treatment. Thayer Headings encouraged patient to present on Monday to speak with Dr. Tammi Klippel. However, Dr. Tammi Klippel is not in the office Monday. Phoned patient to explain this and get additional details about desire to stop treatment.

## 2015-02-25 ENCOUNTER — Ambulatory Visit: Payer: Medicare Other

## 2015-02-25 ENCOUNTER — Ambulatory Visit
Admission: RE | Admit: 2015-02-25 | Discharge: 2015-02-25 | Disposition: A | Discharge status: Home / Self Care | Payer: Medicare Other | Source: Ambulatory Visit | Attending: Radiation Oncology | Admitting: Radiation Oncology

## 2015-02-25 DIAGNOSIS — Z510 Encounter for antineoplastic radiation therapy: Secondary | ICD-10-CM | POA: Diagnosis not present

## 2015-02-26 ENCOUNTER — Inpatient Hospital Stay (HOSPITAL_COMMUNITY): Payer: Medicare Other

## 2015-02-26 ENCOUNTER — Ambulatory Visit: Payer: Medicare Other | Admitting: Radiation Oncology

## 2015-02-26 ENCOUNTER — Encounter: Payer: Self-pay | Admitting: Radiation Oncology

## 2015-02-26 ENCOUNTER — Ambulatory Visit
Admission: RE | Admit: 2015-02-26 | Discharge: 2015-02-26 | Disposition: A | Discharge status: Home / Self Care | Payer: Medicare Other | Source: Ambulatory Visit | Attending: Radiation Oncology | Admitting: Radiation Oncology

## 2015-02-26 ENCOUNTER — Emergency Department (HOSPITAL_COMMUNITY): Payer: Medicare Other

## 2015-02-26 ENCOUNTER — Encounter (HOSPITAL_COMMUNITY): Payer: Self-pay | Admitting: Emergency Medicine

## 2015-02-26 ENCOUNTER — Inpatient Hospital Stay (HOSPITAL_COMMUNITY)
Admission: EM | Admit: 2015-02-26 | Discharge: 2015-03-03 | DRG: 871 | Disposition: A | Discharge status: Hospice | Payer: Medicare Other | Source: Ambulatory Visit | Attending: Internal Medicine | Admitting: Internal Medicine

## 2015-02-26 ENCOUNTER — Other Ambulatory Visit: Payer: Self-pay

## 2015-02-26 VITALS — BP 118/62 | HR 133 | Temp 97.7°F | Resp 20 | Wt 156.1 lb

## 2015-02-26 DIAGNOSIS — D638 Anemia in other chronic diseases classified elsewhere: Secondary | ICD-10-CM | POA: Diagnosis not present

## 2015-02-26 DIAGNOSIS — R0902 Hypoxemia: Secondary | ICD-10-CM

## 2015-02-26 DIAGNOSIS — J189 Pneumonia, unspecified organism: Secondary | ICD-10-CM | POA: Diagnosis present

## 2015-02-26 DIAGNOSIS — D630 Anemia in neoplastic disease: Secondary | ICD-10-CM | POA: Diagnosis present

## 2015-02-26 DIAGNOSIS — J209 Acute bronchitis, unspecified: Secondary | ICD-10-CM | POA: Diagnosis not present

## 2015-02-26 DIAGNOSIS — I4891 Unspecified atrial fibrillation: Secondary | ICD-10-CM | POA: Diagnosis present

## 2015-02-26 DIAGNOSIS — I471 Supraventricular tachycardia: Secondary | ICD-10-CM | POA: Diagnosis present

## 2015-02-26 DIAGNOSIS — C3490 Malignant neoplasm of unspecified part of unspecified bronchus or lung: Secondary | ICD-10-CM | POA: Diagnosis not present

## 2015-02-26 DIAGNOSIS — C3412 Malignant neoplasm of upper lobe, left bronchus or lung: Secondary | ICD-10-CM | POA: Diagnosis present

## 2015-02-26 DIAGNOSIS — Z87891 Personal history of nicotine dependence: Secondary | ICD-10-CM | POA: Diagnosis not present

## 2015-02-26 DIAGNOSIS — A419 Sepsis, unspecified organism: Secondary | ICD-10-CM | POA: Diagnosis present

## 2015-02-26 DIAGNOSIS — Z9889 Other specified postprocedural states: Secondary | ICD-10-CM

## 2015-02-26 DIAGNOSIS — E871 Hypo-osmolality and hyponatremia: Secondary | ICD-10-CM | POA: Diagnosis present

## 2015-02-26 DIAGNOSIS — R0600 Dyspnea, unspecified: Secondary | ICD-10-CM | POA: Insufficient documentation

## 2015-02-26 DIAGNOSIS — J90 Pleural effusion, not elsewhere classified: Secondary | ICD-10-CM | POA: Diagnosis present

## 2015-02-26 DIAGNOSIS — R000 Tachycardia, unspecified: Secondary | ICD-10-CM

## 2015-02-26 DIAGNOSIS — J9601 Acute respiratory failure with hypoxia: Secondary | ICD-10-CM | POA: Diagnosis present

## 2015-02-26 DIAGNOSIS — Z66 Do not resuscitate: Secondary | ICD-10-CM | POA: Diagnosis not present

## 2015-02-26 DIAGNOSIS — C3492 Malignant neoplasm of unspecified part of left bronchus or lung: Secondary | ICD-10-CM

## 2015-02-26 DIAGNOSIS — Z515 Encounter for palliative care: Secondary | ICD-10-CM | POA: Diagnosis not present

## 2015-02-26 DIAGNOSIS — I10 Essential (primary) hypertension: Secondary | ICD-10-CM | POA: Diagnosis present

## 2015-02-26 DIAGNOSIS — J984 Other disorders of lung: Secondary | ICD-10-CM

## 2015-02-26 DIAGNOSIS — I509 Heart failure, unspecified: Secondary | ICD-10-CM | POA: Diagnosis not present

## 2015-02-26 LAB — PHOSPHORUS: Phosphorus: 3 mg/dL (ref 2.5–4.6)

## 2015-02-26 LAB — COMPREHENSIVE METABOLIC PANEL
ALT: 29 U/L (ref 14–54)
AST: 29 U/L (ref 15–41)
Albumin: 2.9 g/dL — ABNORMAL LOW (ref 3.5–5.0)
Alkaline Phosphatase: 105 U/L (ref 38–126)
Anion gap: 7 (ref 5–15)
BILIRUBIN TOTAL: 1 mg/dL (ref 0.3–1.2)
BUN: 13 mg/dL (ref 6–20)
CO2: 26 mmol/L (ref 22–32)
CREATININE: 0.53 mg/dL (ref 0.44–1.00)
Calcium: 8.4 mg/dL — ABNORMAL LOW (ref 8.9–10.3)
Chloride: 98 mmol/L — ABNORMAL LOW (ref 101–111)
Glucose, Bld: 114 mg/dL — ABNORMAL HIGH (ref 65–99)
POTASSIUM: 4.9 mmol/L (ref 3.5–5.1)
Sodium: 131 mmol/L — ABNORMAL LOW (ref 135–145)
TOTAL PROTEIN: 7.2 g/dL (ref 6.5–8.1)

## 2015-02-26 LAB — BLOOD GAS, ARTERIAL
Acid-base deficit: 2.6 mmol/L — ABNORMAL HIGH (ref 0.0–2.0)
Bicarbonate: 22.8 mEq/L (ref 20.0–24.0)
Drawn by: 232811
FIO2: 0.5
O2 Saturation: 95.9 %
PATIENT TEMPERATURE: 98.3
PCO2 ART: 45 mmHg (ref 35.0–45.0)
PH ART: 7.325 — AB (ref 7.350–7.450)
PO2 ART: 96.3 mmHg (ref 80.0–100.0)
TCO2: 21.5 mmol/L (ref 0–100)

## 2015-02-26 LAB — CBC WITH DIFFERENTIAL/PLATELET
Basophils Absolute: 0 10*3/uL (ref 0.0–0.1)
Basophils Relative: 0 %
EOS ABS: 0.1 10*3/uL (ref 0.0–0.7)
EOS PCT: 1 %
HCT: 28.7 % — ABNORMAL LOW (ref 36.0–46.0)
Hemoglobin: 9.3 g/dL — ABNORMAL LOW (ref 12.0–15.0)
LYMPHS ABS: 0.5 10*3/uL — AB (ref 0.7–4.0)
Lymphocytes Relative: 4 %
MCH: 28.5 pg (ref 26.0–34.0)
MCHC: 32.4 g/dL (ref 30.0–36.0)
MCV: 88 fL (ref 78.0–100.0)
MONOS PCT: 12 %
Monocytes Absolute: 1.4 10*3/uL — ABNORMAL HIGH (ref 0.1–1.0)
Neutro Abs: 9.5 10*3/uL — ABNORMAL HIGH (ref 1.7–7.7)
Neutrophils Relative %: 83 %
Platelets: 392 10*3/uL (ref 150–400)
RBC: 3.26 MIL/uL — ABNORMAL LOW (ref 3.87–5.11)
RDW: 15 % (ref 11.5–15.5)
WBC: 11.5 10*3/uL — AB (ref 4.0–10.5)

## 2015-02-26 LAB — BRAIN NATRIURETIC PEPTIDE: B Natriuretic Peptide: 244.4 pg/mL — ABNORMAL HIGH (ref 0.0–100.0)

## 2015-02-26 LAB — MAGNESIUM: Magnesium: 1.9 mg/dL (ref 1.7–2.4)

## 2015-02-26 MED ORDER — LEVALBUTEROL HCL 0.63 MG/3ML IN NEBU
0.6300 mg | INHALATION_SOLUTION | Freq: Four times a day (QID) | RESPIRATORY_TRACT | Status: DC
  Administered 2015-02-26 – 2015-03-03 (×18): 0.63 mg via RESPIRATORY_TRACT
  Filled 2015-02-26 (×18): qty 3

## 2015-02-26 MED ORDER — ONDANSETRON HCL 4 MG/2ML IJ SOLN: 4.0000 mg | Freq: Four times a day (QID) | INTRAMUSCULAR | Status: DC | PRN

## 2015-02-26 MED ORDER — VANCOMYCIN HCL IN DEXTROSE 1-5 GM/200ML-% IV SOLN
1000.0000 mg | Freq: Once | INTRAVENOUS | Status: AC
  Administered 2015-02-26: 1000 mg via INTRAVENOUS
  Filled 2015-02-26: qty 200

## 2015-02-26 MED ORDER — LORAZEPAM 0.5 MG PO TABS
0.5000 mg | ORAL_TABLET | Freq: Two times a day (BID) | ORAL | Status: DC | PRN
  Administered 2015-02-26 – 2015-03-03 (×7): 0.5 mg via ORAL
  Filled 2015-02-26 (×7): qty 1

## 2015-02-26 MED ORDER — ACETAMINOPHEN 500 MG PO TABS
1000.0000 mg | ORAL_TABLET | Freq: Four times a day (QID) | ORAL | Status: DC | PRN
  Administered 2015-02-27 – 2015-03-03 (×5): 1000 mg via ORAL
  Filled 2015-02-26 (×5): qty 2

## 2015-02-26 MED ORDER — METOPROLOL TARTRATE 25 MG PO TABS
50.0000 mg | ORAL_TABLET | Freq: Two times a day (BID) | ORAL | Status: DC
Start: 2015-02-26 — End: 2015-02-27
  Administered 2015-02-26 – 2015-02-27 (×2): 50 mg via ORAL
  Filled 2015-02-26 (×2): qty 2

## 2015-02-26 MED ORDER — METOPROLOL TARTRATE 1 MG/ML IV SOLN
10.0000 mg | Freq: Once | INTRAVENOUS | Status: AC
  Administered 2015-02-26: 10 mg via INTRAVENOUS

## 2015-02-26 MED ORDER — GUAIFENESIN ER 600 MG PO TB12
600.0000 mg | ORAL_TABLET | Freq: Two times a day (BID) | ORAL | Status: DC
  Administered 2015-02-26 – 2015-03-03 (×10): 600 mg via ORAL
  Filled 2015-02-26 (×10): qty 1

## 2015-02-26 MED ORDER — PIPERACILLIN-TAZOBACTAM 3.375 G IVPB
3.3750 g | Freq: Once | INTRAVENOUS | Status: DC
  Filled 2015-02-26: qty 50

## 2015-02-26 MED ORDER — SODIUM CHLORIDE 0.9 % IV BOLUS (SEPSIS): 500.0000 mL | Freq: Once | INTRAVENOUS | Status: DC

## 2015-02-26 MED ORDER — METOPROLOL TARTRATE 1 MG/ML IV SOLN
INTRAVENOUS | Status: AC
  Filled 2015-02-26: qty 5

## 2015-02-26 MED ORDER — METHYLPREDNISOLONE SODIUM SUCC 125 MG IJ SOLR
60.0000 mg | Freq: Four times a day (QID) | INTRAMUSCULAR | Status: DC
  Administered 2015-02-26 – 2015-02-27 (×3): 60 mg via INTRAVENOUS
  Filled 2015-02-26 (×3): qty 2

## 2015-02-26 MED ORDER — ENOXAPARIN SODIUM 40 MG/0.4ML ~~LOC~~ SOLN
40.0000 mg | SUBCUTANEOUS | Status: DC
  Administered 2015-02-26 – 2015-03-01 (×4): 40 mg via SUBCUTANEOUS
  Filled 2015-02-26 (×4): qty 0.4

## 2015-02-26 MED ORDER — PIPERACILLIN-TAZOBACTAM 3.375 G IVPB
3.3750 g | Freq: Three times a day (TID) | INTRAVENOUS | Status: DC
  Administered 2015-02-27 – 2015-03-02 (×10): 3.375 g via INTRAVENOUS
  Filled 2015-02-26 (×9): qty 50

## 2015-02-26 MED ORDER — VANCOMYCIN HCL 500 MG IV SOLR
500.0000 mg | Freq: Two times a day (BID) | INTRAVENOUS | Status: DC
  Administered 2015-02-27 – 2015-03-01 (×6): 500 mg via INTRAVENOUS
  Filled 2015-02-26 (×8): qty 500

## 2015-02-26 MED ORDER — ONDANSETRON HCL 4 MG PO TABS: 4.0000 mg | ORAL_TABLET | Freq: Four times a day (QID) | ORAL | Status: DC | PRN

## 2015-02-26 MED ORDER — SODIUM CHLORIDE 0.9 % IV BOLUS (SEPSIS)
500.0000 mL | Freq: Once | INTRAVENOUS | Status: AC
  Administered 2015-02-26: 500 mL via INTRAVENOUS

## 2015-02-26 MED ORDER — SODIUM CHLORIDE 0.9 % IV BOLUS (SEPSIS)
1000.0000 mL | Freq: Once | INTRAVENOUS | Status: AC
  Administered 2015-02-26: 1000 mL via INTRAVENOUS

## 2015-02-26 MED ORDER — IOHEXOL 350 MG/ML SOLN
100.0000 mL | Freq: Once | INTRAVENOUS | Status: AC | PRN
  Administered 2015-02-26: 100 mL via INTRAVENOUS

## 2015-02-26 MED ORDER — SODIUM CHLORIDE 0.9 % IV SOLN
INTRAVENOUS | Status: DC
  Administered 2015-02-26: 21:00:00 via INTRAVENOUS

## 2015-02-26 NOTE — ED Provider Notes (Signed)
CSN: [id number]     Arrival date & time 02/26/15  1541 History   First MD Initiated Contact with Patient 02/26/15 1553     Chief Complaint  Patient presents with  . Shortness of Breath  . Weakness     (Consider location/radiation/quality/duration/timing/severity/associated sxs/prior Treatment) HPI 81 year old who presents with shortness of breath. History of non-small cell lung cancer currently on radiation treatment. She is not on home oxygen. Has had a few weeks of progressively worsening shortness of breath, and cough productive of clear and frothy sputum. Was seen at the oncology clinic for radiation treatment today was noted to have a large pleural effusion on their CT scan. Sent to the ED for evaluation. She has not had hemoptysis, purulent sputum, fevers, chills, chest pain, lower extremity edema.  Decreased Po intake during this time. Past Medical History  Diagnosis Date  . Cataract     bilateral  . H/O hernia repair   . Lung cancer ([place])     suspected stage III non small cell lung cancer    Past Surgical History  Procedure Laterality Date  . Abdominal hysterectomy    . Video bronchoscopy Bilateral 12/04/2014    Procedure: VIDEO BRONCHOSCOPY WITH FLUORO;  Surgeon: Rigoberto Noel, MD;  Location: [facility];  Service: Cardiopulmonary;  Laterality: Bilateral;  . Cataract extraction Bilateral   . Bladder suspension    . Hernia repair     Family History  Problem Relation Age of Onset  . Stroke Father   . Cancer Sister   . Cancer Brother    Social History  Substance Use Topics  . Smoking status: Former Smoker -- 0.50 packs/day for 10 years    Quit date: 11/27/1972  . Smokeless tobacco: Never Used  . Alcohol Use: No   OB History    No data available     Review of Systems 10/14 systems reviewed and are negative other than those stated in the HPI    Allergies  Review of patient's allergies indicates no known allergies.  Home Medications   Prior to Admission  medications   Medication Sig Start Date End Date Taking? Authorizing Provider  acetaminophen (TYLENOL) 500 MG tablet Take 1,000 mg by mouth every 6 (six) hours as needed for moderate pain.   Yes Historical Provider, MD  acetaminophen (TYLENOL) 650 MG CR tablet Take 650 mg by mouth every 8 (eight) hours as needed for pain.   Yes Historical Provider, MD  Bisacodyl (LAXATIVE PO) Take 1 tablet by mouth daily as needed (contstipation).   Yes Historical Provider, MD  guaiFENesin-codeine 100-10 MG/5ML syrup Take 10 mLs by mouth every 4 (four) hours as needed for cough. 01/24/15  Yes Thurnell Lose, MD  LORazepam (ATIVAN) 0.5 MG tablet Take 0.5 mg by mouth 2 (two) times daily as needed for anxiety.  11/30/14  Yes Historical Provider, MD  metoprolol (LOPRESSOR) 50 MG tablet Take 1 tablet (50 mg total) by mouth 2 (two) times daily. 01/24/15  Yes Thurnell Lose, MD  Multiple Vitamins-Minerals (CENTRUM ADULTS PO) Take 1 tablet by mouth daily.    Yes Historical Provider, MD   BP 91/79 mmHg  Pulse 136  Temp(Src) 97.8 F (36.6 C) (Oral)  Resp 31  Ht '5\' 5"'$  (1.651 m)  Wt 160 lb 7.9 oz (72.8 kg)  BMI 26.71 kg/m2  SpO2 93% Physical Exam  Physical Exam  Nursing note and vitals reviewed. Constitutional: Elderly woman, mild to moderate respiratory distress Head: Normocephalic and atraumatic.  Mouth/Throat: Oropharynx is clear and dry.  Neck: Normal range of motion. Neck supple.  Cardiovascular: Normal rate and regular rhythm.   Pulmonary/Chestachypnea. Speaks in short 4-5 word sentences. Decreased breath sounds over the left hemithorax. Abdminal: Soft. Non-distended. There is no tenderness. There is no rebound and no guarding.  Musculoskeletal: Normal range of motion.  Neurological: Alert, no facial droop, fluent speech, moves all extremities symmetrically Skin: Skin is warm and dry.  Psychiatric: Cooperative  ED Course  Procedures (including critical care time) Labs Review Labs Reviewed   COMPREHENSIVE METABOLIC PANEL - Abnormal; Notable for the following:    Sodium 131 (*)    Chloride 98 (*)    Glucose, Bld 114 (*)    Calcium 8.4 (*)    Albumin 2.9 (*)    All other components within normal limits  BRAIN NATRIURETIC PEPTIDE - Abnormal; Notable for the following:    B Natriuretic Peptide 244.4 (*)    All other components within normal limits  CBC WITH DIFFERENTIAL/PLATELET - Abnormal; Notable for the following:    WBC 11.5 (*)    RBC 3.26 (*)    Hemoglobin 9.3 (*)    HCT 28.7 (*)    Neutro Abs 9.5 (*)    Lymphs Abs 0.5 (*)    Monocytes Absolute 1.4 (*)    All other components within normal limits  BLOOD GAS, ARTERIAL - Abnormal; Notable for the following:    pH, Arterial 7.325 (*)    Acid-base deficit 2.6 (*)    All other components within normal limits  CBC - Abnormal; Notable for the following:    RBC 3.29 (*)    Hemoglobin 9.5 (*)    HCT 29.6 (*)    All other components within normal limits  COMPREHENSIVE METABOLIC PANEL - Abnormal; Notable for the following:    Sodium 134 (*)    Glucose, Bld 169 (*)    Calcium 8.3 (*)    Albumin 2.5 (*)    All other components within normal limits  BODY FLUID CELL COUNT WITH DIFFERENTIAL - Abnormal; Notable for the following:    Color, Fluid STRAW (*)    Appearance, Fluid HAZY (*)    All other components within normal limits  MRSA PCR SCREENING  CULTURE, BLOOD (ROUTINE X 2)  CULTURE, BLOOD (ROUTINE X 2)  BODY FLUID CULTURE  MAGNESIUM  PHOSPHORUS  STREP PNEUMONIAE URINARY ANTIGEN  CBC WITH DIFFERENTIAL/PLATELET  LEGIONELLA PNEUMOPHILA SEROGP 1 UR AG  LACTATE DEHYDROGENASE, BODY FLUID  PROTEIN, BODY FLUID  GLUCOSE, SEROUS FLUID  PH, BODY FLUID  I-STAT TROPOININ, ED  I-STAT CG4 LACTIC ACID, ED  I-STAT CG4 LACTIC ACID, ED  CYTOLOGY - NON PAP    Imaging Review Dg Chest 1 View  02/27/2015  CLINICAL DATA:  Left pleural effusion.  Status post thoracentesis. EXAM: CHEST 1 VIEW COMPARISON:  Chest x-ray and chest  CT dated 02/26/2015 FINDINGS: There has been slight reduction in the left pleural effusion. Large left upper lobe mass appears unchanged. No pneumothorax. Right lung is clear. Chronic posterior right diaphragmatic hernia containing only fat as demonstrated on the prior CT scan. Pulmonary vascularity is normal.  Heart size is normal. IMPRESSION: No complicating features after left thoracentesis. Slight decrease in the pleural effusion. No pneumothorax. Electronically Signed   By: Lorriane Shire M.D.   On: 02/27/2015 12:02   Ct Angio Chest Pe W/cm &/or Wo Cm  02/26/2015  CLINICAL DATA:  Left upper lobe lung cancer, getting radiation, new left pleural effusion. Shortness  of breath, fatigue, wheezing, productive cough. EXAM: CT ANGIOGRAPHY CHEST WITH CONTRAST TECHNIQUE: Multidetector CT imaging of the chest was performed using the standard protocol during bolus administration of intravenous contrast. Multiplanar CT image reconstructions and MIPs were obtained to evaluate the vascular anatomy. CONTRAST:  114m OMNIPAQUE IOHEXOL 350 MG/ML SOLN COMPARISON:  CTA chest dated 01/22/2015 FINDINGS: Limited evaluation due to respiratory motion. No evidence of pulmonary embolism to the lobar level. Mediastinum/Nodes: Heart is normal in size. No pericardial effusion. Coronary atherosclerosis. Atherosclerotic calcifications of the aortic arch. Prominent mediastinal lymph nodes, including a 10 mm short axis right paratracheal node (series 4/ image 32) and a 9 mm short axis subcarinal node (series 4/ image 38), grossly unchanged. Visualized thyroid is grossly unremarkable. Lungs/Pleura: Evaluation of lung parenchyma is constrained by respiratory motion. 9.8 x 7.8 cm anterior left upper lobe mass which abuts the mediastinum (series 4/image 30), previously 11.0 x 7.8 cm, corresponding to known primary bronchogenic neoplasm. The mass extends inferiorly to the left hilar region (series 4/image 40). Associated small to moderate left  pleural effusion, increased, previously a trace pleural effusion. Compressive atelectasis in the left lower lobe. Additional increased interstitial markings in the lingula and left lower lobe (series 7/image 42) are favored to reflect atelectasis or possibly radiation changes, lymphangitic spread of tumor considered less likely. Right posterior Bochdalek's hernia. No pneumothorax. Upper abdomen: Visualized upper abdomen is grossly unremarkable. The patient's known right extrarenal pelvis is minimally visualized on the bottom most slice (series 4/ image 83). The prior hypervascular splenic lesion is not apparent on the current study. Musculoskeletal: Mild degenerative changes of the visualized thoracolumbar spine. Review of the MIP images confirms the above findings. IMPRESSION: Motion degraded images. No evidence of pulmonary embolism to the lobar level. 9.8 x 7.8 cm left upper lobe mass, corresponding to known primary bronchogenic neoplasm, decreased. Small to moderate left pleural effusion, increased. Associated compressive atelectasis and suspected radiation changes in the left lower lobe and lingula. Mild mediastinal lymphadenopathy, grossly unchanged. Additional ancillary findings as above. Electronically Signed   By: SJulian HyM.D.   On: 02/26/2015 20:44   Dg Chest Portable 1 View  02/26/2015  CLINICAL DATA:  Acute shortness of breath. Known left upper lobe cancer. EXAM: PORTABLE CHEST 1 VIEW COMPARISON:  02/07/2015 and prior studies FINDINGS: A new small to moderate left pleural effusion and left lower lung consolidations/ atelectasis noted. A left upper lobe mass is again identified. Mild right basilar atelectasis/ airspace disease identified. There is no evidence of pneumothorax. IMPRESSION: New left pleural effusion and left lower lung consolidation/atelectasis. Mild right basilar atelectasis/airspace disease. Left upper lobe mass/malignancy again identified. Electronically Signed   By: JMargarette CanadaM.D.   On: 02/26/2015 16:31   I have personally reviewed and evaluated these images and lab results as part of my medical decision-making.   EKG Interpretation   Date/Time:  Tuesday February 26 2015 15:42:24 EDT Ventricular Rate:  139 PR Interval:  130 QRS Duration: 76 QT Interval:  295 QTC Calculation: 449 R Axis:   -59 Text Interpretation:  Sinus tachycardia Left anterior fascicular block Low  voltage, precordial leads Consider anterior infarct ED PHYSICIAN  INTERPRETATION AVAILABLE IN [facility] Confirmed by TEST, Record  ([id number] on 02/27/2015 6:46:47 AM       EKG 02/26/2015 HR 130, PR 130, QTc 449 Sinus tachycardia, LAFB  CRITICAL CARE Performed by: DForde Dandy  Total critical care time: 35 minutes  Critical care time was exclusive of separately  billable procedures and treating other patients.  Critical care was necessary to treat or prevent imminent or life-threatening deterioration.  Critical care was time spent personally by me on the following activities: development of treatment plan with patient and/or surrogate as well as nursing, discussions with consultants, evaluation of patient's response to treatment, examination of patient, obtaining history from patient or surrogate, ordering and performing treatments and interventions, ordering and review of laboratory studies, ordering and review of radiographic studies, pulse oximetry and re-evaluation of patient's condition.   MDM   Final diagnoses:  HCAP (healthcare-associated pneumonia)  Hypoxia  Non-small cell lung cancer, left ([place])    81 year old female who presents with shortness of breath. On arrival does appear to be in moderate respiratory distress with hypoxia, and a new 2 L oxygen requirement. Diminished breath sounds over the right lung, and on x-ray showing a large left upper lobe mass consistent with her known non-small cell lung cancer. Infiltrates noted in the left lower lobe as well as  right lower lobe that is concerning for possible pneumonia especially in the setting of her productive cough with shortness of breath. She also has a new left-sided pleural effusion, but not significant enough to fully explain her tachycardia and shortness of breath today. She is started on vancomycin and Zosyn for hospital-acquired pneumonia especially in the setting of recent hospitalization in September. His received 1 L of IV fluids with persistent tachycardia. At this time we'll hold on CT chest pending response to antibiotics, as she is also at risk for PE.  Discussed with the hospitalist and admitted to stepdown for ongoing management. Goals of care were discussed with this patient, and she at this time would like to remain full code.    Forde Dandy, MD 02/27/15 1235

## 2015-02-26 NOTE — Progress Notes (Signed)
Patient requesting to be seen by a physician today following radiation treatment. Patient states, "I feel like I am going to die." Patient explains she is weak, short of breath and feels bad. Patient heart rate elevate 130-133. Patient using accessory muscles to breath. Patient reports shortness of breath at rest and with exertion. Productive cough with clear sputum. Denies hemoptysis. Reports she is after to sleep for fear thick sputum will strangle her. Patient had "high dose" flu shot on October 4th. Reports intermittent pain in left ear, center of chest, left side of face and low abdomen. Reports occasional dizziness. Reports sweat without chills continue intermittently. Wheezing audible. Daughter confirms decreased appetite. Hoarseness worse. Reports headache. Denies diplopia or ringing in the ears.   BP 118/62 mmHg  Pulse 133  Temp(Src) 97.7 F (36.5 C) (Oral)  Resp 20  Wt 156 lb 1.6 oz (70.806 kg)  SpO2 95% Wt Readings from Last 3 Encounters:  02/26/15 156 lb 1.6 oz (70.806 kg)  02/21/15 157 lb (71.215 kg)  02/19/15 154 lb 11.2 oz (70.171 kg)

## 2015-02-26 NOTE — Progress Notes (Signed)
Wheeled patient and escorted her daughter to emergency room access B. Assisted patient into gown then, hospital bed. Elevated HOB. Placed call bell within reach. Gave report to Terrence Dupont, Therapist, sports, BSN. Will access patient status tomorrow.

## 2015-02-26 NOTE — ED Notes (Signed)
31 yof presents to the ED from cancer center. Patient has a hx of left upper lobe cancer. Today patient was getting radiation and her cone beam CT scan showed a left plural effusion. Patient c/o SOB and fatigue.Denies chest pain. Wheezes and diminished lung sounds noted. Patient using accessory muscles. Patient has productive cough. Denies hemoptysis. Patient ST on monitor.

## 2015-02-26 NOTE — Progress Notes (Signed)
CC: Dr. Tyler Pita  Weekly Management Note:  Site: Left lung Current Dose:  2700  cGy Projected Dose: 3000  cGy  Narrative: The patient is seen today for routine under treatment assessment. CBCT/MVCT images/port films were reviewed. The chart was reviewed.   The patient is seen today feeling more dyspneic and fatigued.  She is also tachycardic.  She does have a cough productive of clear sputum.  No hemoptysis.  She also reports sweats but without chills intermittently.  On her cone beam CT scan use for target definition today she is seen to have a moderate sized left pleural effusion.  Physical Examination:  Filed Vitals:   02/26/15 1421  BP: 118/62  Pulse: 133  Temp: 97.7 F (36.5 C)  Resp: 20  .  Weight: 156 lb 1.6 oz (70.806 kg).  She is pale, but in no acute respiratory distress.  O2 sat 95% on room air.  Chest examination: there are diminished breath sounds along the lower one third of her left hemithorax.   Impression: Tolerating radiation therapy well, except that she has had worsening dyspnea felt to be secondary to a left pleural effusion presumably related to her left-sided lung cancer.  Plan: I will have her visit the ED for further evaluation and management.

## 2015-02-26 NOTE — ED Notes (Signed)
Bed: RESB Expected date:  Expected time:  Means of arrival:  Comments: [place] Pt

## 2015-02-26 NOTE — ED Notes (Addendum)
Dr. Oleta Mouse aware of patient's heart rate. No orders received at this time.

## 2015-02-26 NOTE — Progress Notes (Signed)
ANTIBIOTIC CONSULT NOTE - INITIAL  Pharmacy Consult for Vanc, Zosyn Indication: pneumonia  No Known Allergies  Patient Measurements: Height: '5\' 5"'$  (165.1 cm) Weight: 154 lb (69.854 kg) IBW/kg (Calculated) : 57  Vital Signs: Temp: 99.3 F (37.4 C) (10/11 1542) Temp Source: Oral (10/11 1542) BP: 153/69 mmHg (10/11 2114) Pulse Rate: 139 (10/11 2115) Intake/Output from previous day:   Intake/Output from this shift:    Labs:  Recent Labs  02/26/15 1626 02/26/15 1710  WBC  --  11.5*  HGB  --  9.3*  PLT  --  392  CREATININE 0.53  --    Estimated Creatinine Clearance: 50.5 mL/min (by C-G formula based on Cr of 0.53). No results for input(s): VANCOTROUGH, VANCOPEAK, VANCORANDOM, GENTTROUGH, GENTPEAK, GENTRANDOM, TOBRATROUGH, TOBRAPEAK, TOBRARND, AMIKACINPEAK, AMIKACINTROU, AMIKACIN in the last 72 hours.   Microbiology: No results found for this or any previous visit (from the past 720 hour(s)).  Medical History: Past Medical History  Diagnosis Date  . Cataract     bilateral  . H/O hernia repair   . Lung cancer ([place])     suspected stage III non small cell lung cancer     Medications:  Anti-infectives    Start     Dose/Rate Route Frequency Ordered Stop   02/27/15 0600  piperacillin-tazobactam (ZOSYN) IVPB 3.375 g     3.375 g 12.5 mL/hr over 240 Minutes Intravenous 3 times per day 02/26/15 2113     02/27/15 0600  vancomycin (VANCOCIN) 500 mg in sodium chloride 0.9 % 100 mL IVPB     500 mg 100 mL/hr over 60 Minutes Intravenous Every 12 hours 02/26/15 2113     02/26/15 1715  vancomycin (VANCOCIN) IVPB 1000 mg/200 mL premix     1,000 mg 200 mL/hr over 60 Minutes Intravenous  Once 02/26/15 1710 02/26/15 1856   02/26/15 1715  piperacillin-tazobactam (ZOSYN) IVPB 3.375 g     3.375 g 12.5 mL/hr over 240 Minutes Intravenous  Once 02/26/15 1710       Assessment: 81 y.o. female with PMH - NSCLC on rads sent from [place] for worsening SOB/cough.  CXR shows small to  moderate left pleural effusion and LLL consolidation vs atelectasis with mild RLL airspace disease.  Pharmacy to begin vancomycin/Zosyn for HCAP.  Note ED Zosyn dose due at 5pm has not been given as of 10pm  10/11 >> vancomycin >> 10/11 >> Zosyn >>    10/11 blood: IP S. pneumo UAg: IP Legionella UAg: IP  Today, 02/26/2015: Afebrile WBC sl elevated Renal: SCr low; CrCl 51 CG   Goal of Therapy:  Vancomycin trough level 15-20 mcg/ml  Eradication of infection Appropriate antibiotic dosing for indication and renal function  Plan:  Day 1 antibiotics Vancomycin 1000 mg IV now, then 500 mg IV q12 hr Measure vancomycin trough levels at steady state as indicated Zosyn 3.375 g IV given once over 30 minutes, then every 8 hrs by 4-hr infusion Follow clinical course, renal function, culture results as available Follow for de-escalation of antibiotics and LOT Note that HCAP is no longer recognized per 2016 IDSA guidelines.  If patient is not immunosuppressed and without known history of MRSA or pseudomonas infections, consider treating as CAP.   Reuel Boom, PharmD, BCPS Pager: [phone] 02/26/2015, 9:57 PM

## 2015-02-26 NOTE — Progress Notes (Signed)
Med scanned by RN but administered by RT

## 2015-02-26 NOTE — Addendum Note (Signed)
Encounter addended by: Heywood Footman, RN on: 02/26/2015  3:45 PM<BR>     Documentation filed: Notes Section

## 2015-02-26 NOTE — H&P (Signed)
PCP:   Simona Huh, MD   Chief Complaint:  Shortness of breath  HPI: 81 year old female who   has a past medical history of Cataract; H/O hernia repair; and Lung cancer ([place]). Patient has a history of non-small cell lung cancer currently on radiation treatment. For past 2 weeks she has had progressively worsening shortness of breath along with cough through 12 are clear and 40 sputum. Today patient was seen at the cancer center for radiation treatment and was noted to have large pleural effusion on the on the CT scan and was sent to the ED for further evaluation. In the ED patient was found to be tachycardic with sinus tachycardia and heart rate of 130s to 140s, she denies chest pain. Has been coughing up clear phlegm. Denies any fever, no nausea vomiting or diarrhea. Chest x-ray the ED showed small to moderate left pleural effusion and left lower lung consolidation/atelectasis. Mild right basilar atelectasis/airspace disease Patient empirically started on vancomycin and Zosyn for possible HCAP.  Allergies:  No Known Allergies    Past Medical History  Diagnosis Date  . Cataract     bilateral  . H/O hernia repair   . Lung cancer ([place])     suspected stage III non small cell lung cancer     Past Surgical History  Procedure Laterality Date  . Abdominal hysterectomy    . Video bronchoscopy Bilateral 12/04/2014    Procedure: VIDEO BRONCHOSCOPY WITH FLUORO;  Surgeon: Rigoberto Noel, MD;  Location: [facility];  Service: Cardiopulmonary;  Laterality: Bilateral;  . Cataract extraction Bilateral   . Bladder suspension    . Hernia repair      Prior to Admission medications   Medication Sig Start Date End Date Taking? Authorizing Provider  acetaminophen (TYLENOL) 500 MG tablet Take 1,000 mg by mouth every 6 (six) hours as needed for moderate pain.   Yes Historical Provider, MD  acetaminophen (TYLENOL) 650 MG CR tablet Take 650 mg by mouth every 8 (eight) hours as needed for pain.    Yes Historical Provider, MD  Bisacodyl (LAXATIVE PO) Take 1 tablet by mouth daily as needed (contstipation).   Yes Historical Provider, MD  guaiFENesin-codeine 100-10 MG/5ML syrup Take 10 mLs by mouth every 4 (four) hours as needed for cough. 01/24/15  Yes Thurnell Lose, MD  LORazepam (ATIVAN) 0.5 MG tablet Take 0.5 mg by mouth 2 (two) times daily as needed for anxiety.  11/30/14  Yes Historical Provider, MD  metoprolol (LOPRESSOR) 50 MG tablet Take 1 tablet (50 mg total) by mouth 2 (two) times daily. 01/24/15  Yes Thurnell Lose, MD  Multiple Vitamins-Minerals (CENTRUM ADULTS PO) Take 1 tablet by mouth daily.    Yes Historical Provider, MD    Social History:  reports that she quit smoking about 42 years ago. She has never used smokeless tobacco. She reports that she does not drink alcohol or use illicit drugs.  Family History  Problem Relation Age of Onset  . Stroke Father   . Cancer Sister   . Cancer Brother     Danley Danker Weights   02/26/15 1551  Weight: 69.854 kg (154 lb)    All the positives are listed in BOLD  Review of Systems:  HEENT: Headache, blurred vision, runny nose, sore throat Neck: Hypothyroidism, hyperthyroidism,,lymphadenopathy Chest : Shortness of breath, history of COPD, Asthma Heart : Chest pain, history of coronary arterey disease GI:  Nausea, vomiting, diarrhea, constipation, GERD GU: Dysuria, urgency, frequency of urination, hematuria Neuro:  Stroke, seizures, syncope Psych: Depression, anxiety, hallucinations   Physical Exam: Blood pressure 160/88, pulse 140, temperature 99.3 F (37.4 C), temperature source Oral, resp. rate 34, height '5\' 5"'$  (1.651 m), weight 69.854 kg (154 lb), SpO2 96 %. Constitutional:   Patient is a well-developed and well-nourished female* in no acute distress and cooperative with exam. Head: Normocephalic and atraumatic Mouth: Mucus membranes moist Eyes: PERRL, EOMI, conjunctivae normal Neck: Supple, No Thyromegaly Cardiovascular:  RRR, S1 normal, S2 normal Pulmonary/Chest: Bilateral rhonchi Abdominal: Soft. Non-tender, non-distended, bowel sounds are normal, no masses, organomegaly, or guarding present.  Neurological: A&O x3, Strength is normal and symmetric bilaterally, cranial nerve II-XII are grossly intact, no focal motor deficit, sensory intact to light touch bilaterally.  Extremities : No Cyanosis, Clubbing or Edema  Labs on Admission:  Basic Metabolic Panel:  Recent Labs Lab 02/26/15 1626  NA 131*  K 4.9  CL 98*  CO2 26  GLUCOSE 114*  BUN 13  CREATININE 0.53  CALCIUM 8.4*  MG 1.9  PHOS 3.0   Liver Function Tests:  Recent Labs Lab 02/26/15 1626  AST 29  ALT 29  ALKPHOS 105  BILITOT 1.0  PROT 7.2  ALBUMIN 2.9*   No results for input(s): LIPASE, AMYLASE in the last 168 hours. No results for input(s): AMMONIA in the last 168 hours. CBC:  Recent Labs Lab 02/26/15 1710  WBC 11.5*  NEUTROABS 9.5*  HGB 9.3*  HCT 28.7*  MCV 88.0  PLT 392   Cardiac Enzymes: No results for input(s): CKTOTAL, CKMB, CKMBINDEX, TROPONINI in the last 168 hours.  BNP (last 3 results)  Recent Labs  02/26/15 1710  BNP 244.4*     Radiological Exams on Admission: Dg Chest Portable 1 View  02/26/2015   CLINICAL DATA:  Acute shortness of breath. Known left upper lobe cancer.  EXAM: PORTABLE CHEST 1 VIEW  COMPARISON:  02/07/2015 and prior studies  FINDINGS: A new small to moderate left pleural effusion and left lower lung consolidations/ atelectasis noted.  A left upper lobe mass is again identified.  Mild right basilar atelectasis/ airspace disease identified.  There is no evidence of pneumothorax.  IMPRESSION: New left pleural effusion and left lower lung consolidation/atelectasis.  Mild right basilar atelectasis/airspace disease.  Left upper lobe mass/malignancy again identified.   Electronically Signed   By: Margarette Canada M.D.   On: 02/26/2015 16:31    EKG: Independently reviewed. Sinus  tachycardia   Assessment/Plan Active Problems:   Sinus tachycardia (HCC)   HCAP (healthcare-associated pneumonia)   Acute bronchitis    Dyspnea Multifactorial, likely from acute bronchitis versus healthcare associated pneumonia. Will continue with vancomycin, Zosyn. Obtain urinary strep pneumonia as well as Legionella urinary antigen. Blood cultures 2. We'll also start Xopenex neb lyses every 6 hours, Solu-Medrol 60 mg IV every 6 hours, Mucinex 1 tablet by mouth twice a day White count is normal, she has been afebrile. If patient continues to remain afebrile and cultures are negative consider narrow down antibiotics over next 24-48 hours. BNP elevated to 244.4. Will obtain echocardiogram to rule out underlying diastolic dysfunction. We'll also obtain CT angiogram of the chest to rule out pulmonary embolism.  Sinus tachycardia Patient has sinus tachycardia, she takes metoprolol at home. Will continue with metoprolol Close to monitor the patient on telemetry  History of non-small cell lung cancer Patient on radiation treatment as outpatient.  Hypertension Continue metoprolol  DVT prophylaxis Lovenox  Code status: Full code  Family discussion: Admission, patients condition and plan  of care including tests being ordered have been discussed with the patient and her daughter at bedside who indicate understanding and agree with the plan and Code Status.   Time Spent on Admission: 60 min  [facility] Pager: [phone] 02/26/2015, 6:50 PM  If 7PM-7AM, please contact night-coverage  [email or web address]  Password [email or web address]

## 2015-02-26 NOTE — Progress Notes (Signed)
   02/26/15 2036 02/26/15 2055 02/26/15 2100  Vitals  BP --  (!) 201/78 mmHg --   MAP (mmHg) --  123 --   Pulse Rate --  --  (!) 151  ECG Heart Rate --  --  (!) 141  Resp --  --  (!) 37  Oxygen Therapy  SpO2 --  --  92 %  O2 Device --  --  Venturi Mask  Pain Assessment  Pain Assessment 0-10 --  --   Pain Score 0 --  --    Notified [facility] of status new orders noted and received.

## 2015-02-27 ENCOUNTER — Inpatient Hospital Stay (HOSPITAL_COMMUNITY): Payer: Medicare Other

## 2015-02-27 ENCOUNTER — Ambulatory Visit: Payer: Medicare Other

## 2015-02-27 ENCOUNTER — Ambulatory Visit
Admit: 2015-02-27 | Discharge: 2015-02-27 | Disposition: A | Discharge status: Home / Self Care | Payer: Medicare Other | Attending: Radiation Oncology | Admitting: Radiation Oncology

## 2015-02-27 DIAGNOSIS — J189 Pneumonia, unspecified organism: Secondary | ICD-10-CM

## 2015-02-27 DIAGNOSIS — I4891 Unspecified atrial fibrillation: Secondary | ICD-10-CM

## 2015-02-27 DIAGNOSIS — J9601 Acute respiratory failure with hypoxia: Secondary | ICD-10-CM | POA: Diagnosis present

## 2015-02-27 DIAGNOSIS — C3490 Malignant neoplasm of unspecified part of unspecified bronchus or lung: Secondary | ICD-10-CM | POA: Diagnosis present

## 2015-02-27 DIAGNOSIS — C3492 Malignant neoplasm of unspecified part of left bronchus or lung: Secondary | ICD-10-CM

## 2015-02-27 DIAGNOSIS — D638 Anemia in other chronic diseases classified elsewhere: Secondary | ICD-10-CM | POA: Diagnosis present

## 2015-02-27 DIAGNOSIS — C3412 Malignant neoplasm of upper lobe, left bronchus or lung: Secondary | ICD-10-CM | POA: Diagnosis present

## 2015-02-27 DIAGNOSIS — A419 Sepsis, unspecified organism: Principal | ICD-10-CM

## 2015-02-27 DIAGNOSIS — J90 Pleural effusion, not elsewhere classified: Secondary | ICD-10-CM | POA: Diagnosis present

## 2015-02-27 DIAGNOSIS — E871 Hypo-osmolality and hyponatremia: Secondary | ICD-10-CM | POA: Diagnosis present

## 2015-02-27 DIAGNOSIS — I509 Heart failure, unspecified: Secondary | ICD-10-CM

## 2015-02-27 LAB — COMPREHENSIVE METABOLIC PANEL
ALBUMIN: 2.5 g/dL — AB (ref 3.5–5.0)
ALK PHOS: 102 U/L (ref 38–126)
ALT: 31 U/L (ref 14–54)
AST: 20 U/L (ref 15–41)
Anion gap: 5 (ref 5–15)
BILIRUBIN TOTAL: 0.3 mg/dL (ref 0.3–1.2)
BUN: 10 mg/dL (ref 6–20)
CO2: 26 mmol/L (ref 22–32)
CREATININE: 0.46 mg/dL (ref 0.44–1.00)
Calcium: 8.3 mg/dL — ABNORMAL LOW (ref 8.9–10.3)
Chloride: 103 mmol/L (ref 101–111)
GFR calc Af Amer: >60 mL/min (ref >60)
GLUCOSE: 169 mg/dL — AB (ref 65–99)
Potassium: 4.2 mmol/L (ref 3.5–5.1)
Sodium: 134 mmol/L — ABNORMAL LOW (ref 135–145)
TOTAL PROTEIN: 6.5 g/dL (ref 6.5–8.1)

## 2015-02-27 LAB — STREP PNEUMONIAE URINARY ANTIGEN: Strep Pneumo Urinary Antigen: NEGATIVE

## 2015-02-27 LAB — CBC
HEMATOCRIT: 29.6 % — AB (ref 36.0–46.0)
HEMOGLOBIN: 9.5 g/dL — AB (ref 12.0–15.0)
MCH: 28.9 pg (ref 26.0–34.0)
MCHC: 32.1 g/dL (ref 30.0–36.0)
MCV: 90 fL (ref 78.0–100.0)
Platelets: 387 10*3/uL (ref 150–400)
RBC: 3.29 MIL/uL — AB (ref 3.87–5.11)
RDW: 15.3 % (ref 11.5–15.5)
WBC: 9.2 10*3/uL (ref 4.0–10.5)

## 2015-02-27 LAB — BODY FLUID CELL COUNT WITH DIFFERENTIAL
LYMPHS FL: 12 %
MONOCYTE-MACROPHAGE-SEROUS FLUID: 73 % (ref 50–90)
NEUTROPHIL FLUID: 15 % (ref 0–25)
Total Nucleated Cell Count, Fluid: 827 cu mm (ref 0–1000)

## 2015-02-27 LAB — LACTATE DEHYDROGENASE, PLEURAL OR PERITONEAL FLUID: LD, Fluid: 39 U/L — ABNORMAL HIGH (ref 3–23)

## 2015-02-27 LAB — GRAM STAIN

## 2015-02-27 LAB — PROTEIN, BODY FLUID: TOTAL PROTEIN, FLUID: 3.3 g/dL

## 2015-02-27 LAB — GLUCOSE, SEROUS FLUID: GLUCOSE FL: 226 mg/dL

## 2015-02-27 LAB — MRSA PCR SCREENING: MRSA BY PCR: NEGATIVE

## 2015-02-27 MED ORDER — ENSURE ENLIVE PO LIQD
237.0000 mL | Freq: Two times a day (BID) | ORAL | Status: DC
  Administered 2015-02-27 – 2015-03-03 (×6): 237 mL via ORAL

## 2015-02-27 MED ORDER — METOPROLOL TARTRATE 1 MG/ML IV SOLN
5.0000 mg | Freq: Four times a day (QID) | INTRAVENOUS | Status: DC | PRN
  Administered 2015-02-27: 5 mg via INTRAVENOUS
  Filled 2015-02-27: qty 5

## 2015-02-27 MED ORDER — METOPROLOL TARTRATE 1 MG/ML IV SOLN
5.0000 mg | INTRAVENOUS | Status: DC | PRN
  Administered 2015-02-28 – 2015-03-01 (×4): 5 mg via INTRAVENOUS
  Filled 2015-02-27 (×5): qty 5

## 2015-02-27 MED ORDER — METHYLPREDNISOLONE SODIUM SUCC 125 MG IJ SOLR
60.0000 mg | INTRAMUSCULAR | Status: DC
  Administered 2015-02-28: 60 mg via INTRAVENOUS
  Filled 2015-02-27: qty 2

## 2015-02-27 MED ORDER — METOPROLOL TARTRATE 25 MG PO TABS
75.0000 mg | ORAL_TABLET | Freq: Two times a day (BID) | ORAL | Status: DC
  Administered 2015-02-27 – 2015-03-01 (×4): 75 mg via ORAL
  Filled 2015-02-27 (×4): qty 3

## 2015-02-27 NOTE — Progress Notes (Signed)
Per MD patient to go to ultrasound off monitor. RN did not need to accompany per MD.

## 2015-02-27 NOTE — Progress Notes (Signed)
  Echocardiogram 2D Echocardiogram has been performed.  Joelene Millin 02/27/2015, 3:54 PM

## 2015-02-27 NOTE — Procedures (Signed)
US guided diagnostic/therapeutic left thoracentesis performed yielding 500 cc hazy, yellow fluid. The fluid was sent to the lab for preordered studies. F/u CXR pending. No immediate complications.

## 2015-02-27 NOTE — Progress Notes (Signed)
Initial Nutrition Assessment  DOCUMENTATION CODES:   Not applicable  INTERVENTION:  - Will order Ensure Enlive BID, each supplement provides 350 kcal and 20 grams of protein - RD will continue to monitor for needs  NUTRITION DIAGNOSIS:   Increased nutrient needs related to catabolic illness, cancer and cancer related treatments as evidenced by estimated needs.  GOAL:   Patient will meet greater than or equal to 90% of their needs  MONITOR:   PO intake, Supplement acceptance, Weight trends, Labs, I & O's  REASON FOR ASSESSMENT:   Malnutrition Screening Tool  ASSESSMENT:   Patient has a history of non-small cell lung cancer currently on radiation treatment. For past 2 weeks she has had progressively worsening shortness of breath along with cough through 12 are clear and 40 sputum. Today patient was seen at the cancer center for radiation treatment and was noted to have large pleural effusion on the on the CT scan and was sent to the ED for further evaluation. In the ED patient was found to be tachycardic with sinus tachycardia and heart rate of 130s to 140s, she denies chest pain. Has been coughing up clear phlegm. Denies any fever, no nausea vomiting or diarrhea.  Pt seen for MST. BMI indicates overweight status. Pt reports eating most of breakfast this AM which consisted of eggs, bacon, grits, orange juice and coffee. She did not have problems chewing or swallowing during this meal and has not had abdominal pain or nausea after eating.   She states poor appetite x1-2 days PTA but that appetite is now returning to baseline. She states that her weight has been stable recently; chart reviewed from last 2 months shows stable weight (154-160 lbs). At home she was drinking Boost but does not like the chocolate flavor that is provided here; she is willing to try Ensure Enlive instead.   Likely meeting needs at this time. No muscle or fat wasting noted. Medications reviewed. Labs reviewed;  Na: 134 mmol/L, Ca: 8.3 mg/dL.   Diet Order:  Diet regular Room service appropriate?: Yes; Fluid consistency:: Thin  Skin:  Reviewed, no issues  Last BM:  10/10  Height:   Ht Readings from Last 1 Encounters:  02/26/15 '5\' 5"'$  (1.651 m)    Weight:   Wt Readings from Last 1 Encounters:  02/26/15 160 lb 7.9 oz (72.8 kg)    Ideal Body Weight:  56.82 kg (kg)  BMI:  Body mass index is 26.71 kg/(m^2).  Estimated Nutritional Needs:   Kcal:  1850-2050  Protein:  75-85 grams  Fluid:  2-2.2 L/day  EDUCATION NEEDS:   No education needs identified at this time     Jarome Matin, RD, LDN Inpatient Clinical Dietitian Pager # [phone] After hours/weekend pager # [phone]

## 2015-02-27 NOTE — Care Management Note (Signed)
Case Management Note  Patient Details  Name: AZALIE HARBECK MRN: [id number] Date of Birth: 01-21-1928  Subjective/Objective:                 Respiratory distress   Action/Plan:Date:  Oct. 12, 2016 U.R. performed for needs and level of care. Will continue to follow for Case Management needs.  Velva Harman, RN, BSN, [place]   [phone]   Expected Discharge Date:   (unknown)               Expected Discharge Plan:  Home/Self Care  In-House Referral:  NA  Discharge planning Services  CM Consult  Post Acute Care Choice:  NA Choice offered to:  NA  DME Arranged:    DME Agency:     HH Arranged:    HH Agency:     Status of Service:  In process, will continue to follow  Medicare Important Message Given:    Date Medicare IM Given:    Medicare IM give by:    Date Additional Medicare IM Given:    Additional Medicare Important Message give by:     If discussed at [place] of Stay Meetings, dates discussed:    Additional Comments:  Leeroy Cha, RN 02/27/2015, 10:18 AM

## 2015-02-27 NOTE — Progress Notes (Addendum)
Patient ID: Lindsey Fleming, female   DOB: May 01, 1928, 81 y.o.   MRN: [id number]  [facility] PROGRESS NOTE  Lindsey Fleming LFY:[id number] DOB: 02/08/1928 DOA: 02/26/2015 PCP: Lindsey Huh, MD   Brief narrative:    81 year old female with known Sq Cell LUL lung cancer, currently on radiation treatments, presented to [facility] ED with main concern of 1-2 weeks duration of progressively worsening dyspnea that was initially present with exertion and has progressed to dyspnea at rest in the past 24 - 48 hours, associated with mixed episodes of non productive and productive cough of yellow phlegm, subjective fevers, chills.   In ED, pt noted to be hypoxic with oxygen saturation 87% on RA, RR up to 39, HR up to 150's, CXR notable for moderate left pleural effusion and LLL consolidation concerning for PNA. Pt started on Vanc and Zosyn and TRH asked to admit to SDU for further evaluation and management.   Assessment/Plan:    Principal Problem:   Acute respiratory failure with hypoxia (HCC) - appears to be multifactorial and secondary to known LUL lung cancer with associated and now worsening left pleural effusion which could be malignant, with possible LLL PNA  - agree with keeping pt on ABX for now until more results are back, Vanc and Zosyn day #2 - will also obtain IR consult for left thoracentesis and plan on sending fluid for analysis as this may be malignant in etiology - keep in SDU for now Active Problems:   Sepsis due to pneumonia ([place]) - pt meets criteria for sepsis with HR < 90 and RR > 22, with suspected source of LLL PNA - treated as HCAP for now given known lung cancer  - follow up on sputum analysis and readjust the ABX regimen as clinically indicated - WBC is WNL this AM, will repeat again in A   Recurrent pleural effusion on left - possibly malignant - will ask IR for thoracentesis and will send fluid for analysis    Atrial fibrillation with RVR (HCC) - in sinus tachy  this AM  - continue Metoprolol 50 mg PO BID and add inj as needed    Primary cancer of left upper lobe of lung ([place]), Sq Cell  Ca - will notify pt's radiation oncologist of pt's admission  - notified Dr. Julien Nordmann of pt's admission    Anemia of chronic disease - of malignancy  - no signs of bleeding - will repeat BMP in AM   Acute hyponatremia - likely pre renal in etiology - improving with IVF   DVT prophylaxis - Lovenox SQ  Code Status: Full.  Family Communication:  plan of care discussed with the patient, daughter over the phone  Disposition Plan: Home when stable.   IV access:  Peripheral IV  Procedures and diagnostic studies:     Ct Angio Chest Pe W/cm &/or Wo Cm 02/26/2015  Motion degraded images. No evidence of pulmonary embolism to the lobar level. 9.8 x 7.8 cm left upper lobe mass, corresponding to known primary bronchogenic neoplasm, decreased. Small to moderate left pleural effusion, increased. Associated compressive atelectasis and suspected radiation changes in the left lower lobe and lingula. Mild mediastinal lymphadenopathy, grossly unchanged.   Dg Chest Portable 1 View 02/26/2015 New left pleural effusion and left lower lung consolidation/atelectasis. Mild right basilar atelectasis/airspace disease. Left upper lobe mass/malignancy again identified.   Medical Consultants:  IR  Other Consultants:  None  IAnti-Infectives:   Vancomycin 10/11 --> Zosyn 10/11 -->  [name], MD  [place] Pager [phone]  If 7PM-7AM, please contact night-coverage [email or web address] Password [email or web address] 02/27/2015, 9:18 AM   LOS: 1 day   HPI/Subjective: No events overnight. Still with dyspnea at rest.   Objective: Filed Vitals:   02/27/15 0700 02/27/15 0800 02/27/15 0900 02/27/15 0903  BP: 131/69 142/75 130/52 130/52  Pulse: 134 101 116 130  Temp:      TempSrc:      Resp: 25 29 33 22  Height:      Weight:      SpO2: 95% 95% 95% 95%    Intake/Output Summary (Last 24  hours) at 02/27/15 0918 Last data filed at 02/27/15 0076  Gross per 24 hour  Intake   1495 ml  Output   1700 ml  Net   -205 ml    Exam:   General:  Pt is alert, in mld distress due to dyspnea   Cardiovascular: Irregular rate and rhythm, no rubs, no gallops  Respiratory: Rhonchi on the left side, clear right side, no wheezing   Abdomen: Soft, non tender, non distended, bowel sounds present, no guarding  Data Reviewed: Basic Metabolic Panel:  Recent Labs Lab 02/26/15 1626 02/27/15 0345  NA 131* 134*  K 4.9 4.2  CL 98* 103  CO2 26 26  GLUCOSE 114* 169*  BUN 13 10  CREATININE 0.53 0.46  CALCIUM 8.4* 8.3*  MG 1.9  --   PHOS 3.0  --    Liver Function Tests:  Recent Labs Lab 02/26/15 1626 02/27/15 0345  AST 29 20  ALT 29 31  ALKPHOS 105 102  BILITOT 1.0 0.3  PROT 7.2 6.5  ALBUMIN 2.9* 2.5*   CBC:  Recent Labs Lab 02/26/15 1710 02/27/15 0345  WBC 11.5* 9.2  NEUTROABS 9.5*  --   HGB 9.3* 9.5*  HCT 28.7* 29.6*  MCV 88.0 90.0  PLT 392 387   Recent Results (from the past 240 hour(s))  MRSA PCR Screening     Status: None   Collection Time: 02/26/15 10:30 PM  Result Value Ref Range Status   MRSA by PCR NEGATIVE NEGATIVE Final   Scheduled Meds: . enoxaparin  injection  40 mg Subcutaneous Q24H  . guaiFENesin  600 mg Oral BID  . levalbuterol  0.63 mg Nebulization Q6H  . methylPREDNISolone    60 mg Intravenous Q6H  . metoprolol  50 mg Oral BID  . ZOSYN  IV  3.375 g Intravenous 3 times per day  . vancomycin  500 mg Intravenous Q12H   Continuous Infusions: . sodium chloride 75 mL/hr at 02/26/15 2124

## 2015-02-28 ENCOUNTER — Telehealth: Payer: Self-pay | Admitting: Radiation Oncology

## 2015-02-28 LAB — CBC
HCT: 30.1 % — ABNORMAL LOW (ref 36.0–46.0)
HEMOGLOBIN: 9.7 g/dL — AB (ref 12.0–15.0)
MCH: 28.9 pg (ref 26.0–34.0)
MCHC: 32.2 g/dL (ref 30.0–36.0)
MCV: 89.6 fL (ref 78.0–100.0)
Platelets: 447 10*3/uL — ABNORMAL HIGH (ref 150–400)
RBC: 3.36 MIL/uL — AB (ref 3.87–5.11)
RDW: 15.3 % (ref 11.5–15.5)
WBC: 14.2 10*3/uL — ABNORMAL HIGH (ref 4.0–10.5)

## 2015-02-28 LAB — BASIC METABOLIC PANEL
Anion gap: 7 (ref 5–15)
BUN: 14 mg/dL (ref 6–20)
CO2: 28 mmol/L (ref 22–32)
Calcium: 8.4 mg/dL — ABNORMAL LOW (ref 8.9–10.3)
Chloride: 104 mmol/L (ref 101–111)
Creatinine, Ser: 0.56 mg/dL (ref 0.44–1.00)
GFR calc Af Amer: >60 mL/min (ref >60)
GFR calc non Af Amer: >60 mL/min (ref >60)
Glucose, Bld: 135 mg/dL — ABNORMAL HIGH (ref 65–99)
POTASSIUM: 3.5 mmol/L (ref 3.5–5.1)
Sodium: 139 mmol/L (ref 135–145)

## 2015-02-28 LAB — PH, BODY FLUID: pH, Body Fluid: 7.6

## 2015-02-28 NOTE — Progress Notes (Signed)
   02/28/15 0700  Vitals  BP (!) 190/119 mmHg  MAP (mmHg) 144  Pulse Rate 100  ECG Heart Rate (!) 120  Resp (!) 29  Oxygen Therapy  SpO2 92 %  lopressor 5 mg IV at this time.

## 2015-02-28 NOTE — Progress Notes (Signed)
Dr. Doyle Askew paged for BP 186/93 and HR 120 one hour after lopressor given.

## 2015-02-28 NOTE — Telephone Encounter (Signed)
This RN has attempted several times to reach the patient in her hospital room or on her cell phone. Also, this RN has attempted to reach patient's daughter, [name]. No success. Plan is to cancel final radiation treatment and circle back in a month to assess patient status. Wanting to encourage patient or her daughter to call this RN with needs.

## 2015-02-28 NOTE — Progress Notes (Signed)
Patient ID: Blanca Friend, female   DOB: 1927-06-23, 81 y.o.   MRN: [id number]  [facility] PROGRESS NOTE  Charna Neeb Maslowski CZY:[id number] DOB: 1927/06/27 DOA: 02/26/2015 PCP: Simona Huh, MD   Brief narrative:    81 year old female with known Sq Cell LUL lung cancer, currently on radiation treatments, presented to [facility] ED with main concern of 1-2 weeks duration of progressively worsening dyspnea that was initially present with exertion and has progressed to dyspnea at rest in the past 24 - 48 hours, associated with mixed episodes of non productive and productive cough of yellow phlegm, subjective fevers, chills.   In ED, pt noted to be hypoxic with oxygen saturation 87% on RA, RR up to 39, HR up to 150's, CXR notable for moderate left pleural effusion and LLL consolidation concerning for PNA. Pt started on Vanc and Zosyn and TRH asked to admit to SDU for further evaluation and management.   Assessment/Plan:    Principal Problem:   Acute respiratory failure with hypoxia (HCC) - appears to be multifactorial and secondary to known LUL lung cancer with associated and now worsening left pleural effusion which could be malignant, with possible LLL PNA  - keep pt on ABX for now until more results are back, Vanc and Zosyn day #3 - pt is s/p left thoracentesis, post op day #1, 500 cc fluid removed, fluid analysis pending  - keep in SDU for now  Active Problems:   Sepsis due to pneumonia ([place]) - pt meets criteria for sepsis with HR < 90 and RR > 22, with suspected source of LLL PNA - treated as HCAP for now given known lung cancer  - follow up on sputum analysis and readjust the ABX regimen as clinically indicated - CBC pending this AM    Recurrent pleural effusion on left - possibly malignant - fluid analysis pending     Atrial fibrillation with RVR (HCC) - in sinus tachy this AM  - continue Metoprolol 75 mg PO BID as well as inj as needed     Primary cancer of left upper  lobe of lung ([place]), Sq Cell  Ca - will notify pt's radiation oncologist of pt's admission  - notified Dr. Julien Nordmann of pt's admission     Anemia of chronic disease - of malignancy  - no signs of bleeding - CBC pending    Acute hyponatremia - likely pre renal in etiology  DVT prophylaxis - Lovenox SQ  Code Status: Full.  Family Communication:  plan of care discussed with the patient, daughter at bedside  Disposition Plan: Home when stable.   IV access:  Peripheral IV  Procedures and diagnostic studies:     Ct Angio Chest Pe W/cm &/or Wo Cm 02/26/2015  Motion degraded images. No evidence of pulmonary embolism to the lobar level. 9.8 x 7.8 cm left upper lobe mass, corresponding to known primary bronchogenic neoplasm, decreased. Small to moderate left pleural effusion, increased. Associated compressive atelectasis and suspected radiation changes in the left lower lobe and lingula. Mild mediastinal lymphadenopathy, grossly unchanged.   Dg Chest Portable 1 View 02/26/2015 New left pleural effusion and left lower lung consolidation/atelectasis. Mild right basilar atelectasis/airspace disease. Left upper lobe mass/malignancy again identified.   Medical Consultants:  IR  Other Consultants:  None  IAnti-Infectives:   Vancomycin 10/11 --> Zosyn 10/11 -->  Faye Ramsay, MD  [facility] Pager [phone]  If 7PM-7AM, please contact night-coverage [email or web address] Password [email or web address] 02/28/2015, 6:32 AM   LOS: 2 days  HPI/Subjective: No events overnight. Still with dyspnea at rest.   Objective: Filed Vitals:   02/28/15 0345 02/28/15 0400 02/28/15 0415 02/28/15 0424  BP:  169/79 164/76   Pulse:  92  93  Temp: 97.4 F (36.3 C)     TempSrc: Oral     Resp:  24  23  Height:      Weight: 72.7 kg (160 lb 4.4 oz)     SpO2:  94%  93%    Intake/Output Summary (Last 24 hours) at 02/28/15 7619 Last data filed at 02/27/15 2146  Gross per 24 hour  Intake   1450 ml  Output    400 ml  Net    1050 ml    Exam:   General:  Pt is alert, in mld distress due to dyspnea   Cardiovascular: Irregular rate and rhythm, no rubs, no gallops  Respiratory: Rhonchi on the left side, clear right side, no wheezing   Abdomen: Soft, non tender, non distended, bowel sounds present, no guarding  Data Reviewed: Basic Metabolic Panel:  Recent Labs Lab 02/26/15 1626 02/27/15 0345  NA 131* 134*  K 4.9 4.2  CL 98* 103  CO2 26 26  GLUCOSE 114* 169*  BUN 13 10  CREATININE 0.53 0.46  CALCIUM 8.4* 8.3*  MG 1.9  --   PHOS 3.0  --    Liver Function Tests:  Recent Labs Lab 02/26/15 1626 02/27/15 0345  AST 29 20  ALT 29 31  ALKPHOS 105 102  BILITOT 1.0 0.3  PROT 7.2 6.5  ALBUMIN 2.9* 2.5*   CBC:  Recent Labs Lab 02/26/15 1710 02/27/15 0345  WBC 11.5* 9.2  NEUTROABS 9.5*  --   HGB 9.3* 9.5*  HCT 28.7* 29.6*  MCV 88.0 90.0  PLT 392 387   Recent Results (from the past 240 hour(s))  MRSA PCR Screening     Status: None   Collection Time: 02/26/15 10:30 PM  Result Value Ref Range Status   MRSA by PCR NEGATIVE NEGATIVE Final   Scheduled Meds: . enoxaparin  injection  40 mg Subcutaneous Q24H  . guaiFENesin  600 mg Oral BID  . levalbuterol  0.63 mg Nebulization Q6H  . methylPREDNISolone    60 mg Intravenous Q6H  . metoprolol  50 mg Oral BID  . ZOSYN  IV  3.375 g Intravenous 3 times per day  . vancomycin  500 mg Intravenous Q12H   Continuous Infusions:

## 2015-03-01 ENCOUNTER — Ambulatory Visit: Payer: Medicare Other

## 2015-03-01 ENCOUNTER — Inpatient Hospital Stay (HOSPITAL_COMMUNITY): Payer: Medicare Other

## 2015-03-01 DIAGNOSIS — C3490 Malignant neoplasm of unspecified part of unspecified bronchus or lung: Secondary | ICD-10-CM

## 2015-03-01 DIAGNOSIS — I471 Supraventricular tachycardia: Secondary | ICD-10-CM

## 2015-03-01 DIAGNOSIS — R0600 Dyspnea, unspecified: Secondary | ICD-10-CM | POA: Insufficient documentation

## 2015-03-01 DIAGNOSIS — Z515 Encounter for palliative care: Secondary | ICD-10-CM | POA: Insufficient documentation

## 2015-03-01 LAB — LEGIONELLA PNEUMOPHILA SEROGP 1 UR AG: L. PNEUMOPHILA SEROGP 1 UR AG: NEGATIVE

## 2015-03-01 LAB — VANCOMYCIN, TROUGH: VANCOMYCIN TR: 7 ug/mL — AB (ref 10.0–20.0)

## 2015-03-01 MED ORDER — VANCOMYCIN HCL IN DEXTROSE 750-5 MG/150ML-% IV SOLN
750.0000 mg | Freq: Two times a day (BID) | INTRAVENOUS | Status: DC
  Administered 2015-03-02: 750 mg via INTRAVENOUS
  Filled 2015-03-01: qty 150

## 2015-03-01 MED ORDER — PREDNISONE 20 MG PO TABS: 40.0000 mg | ORAL_TABLET | Freq: Every day | ORAL | Status: DC

## 2015-03-01 MED ORDER — METOPROLOL TARTRATE 50 MG PO TABS
100.0000 mg | ORAL_TABLET | Freq: Two times a day (BID) | ORAL | Status: DC
  Administered 2015-03-01 – 2015-03-03 (×3): 100 mg via ORAL
  Filled 2015-03-01: qty 2
  Filled 2015-03-01 (×2): qty 4
  Filled 2015-03-01: qty 2

## 2015-03-01 NOTE — Consult Note (Signed)
Consultation Note Date: 03/01/2015   Patient Name: Lindsey Fleming  DOB: Nov 06, 1927  MRN: [id number]  Age / Sex: 81 y.o., female   PCP: Lindsey Arabian, MD Referring Physician: Theodis Blaze, MD  Reason for Consultation: Establishing goals of care  Palliative Care Assessment and Plan Summary of Established Goals of Care and Medical Treatment Preferences   Clinical Assessment/Narrative: 81 year old female with SCCa of the lung, completed 9 of 10 radiation treatments, who presented to [facility] ED with 1-2 weeks of progressively worsening dyspnea that was initially present with exertion and progressed to dyspnea at rest.  She was seen by radiation oncology and sent ED for further evaluation   In ED, pt noted to be hypoxic with oxygen saturation 87% on RA, RR up to 39, HR up to 150's, CXR notable for moderate left pleural effusion and LLL consolidation concerning for PNA. She is status post thoracentesis for 500 mL and is being treated for sepsis due to pneumonia.  In speaking with her primary physician, she has noted that she would like to stop radiation treatments and get back to her home as soon as possible. Consulted for goals of care.  I met with patient, her daughter, Lindsey Fleming and her son, Lindsey Fleming.    When asked, she reports the most important things to her are feeling as well as possible for as long as possible, returning to her own home, and that she has always enjoyed fishing.  Her daughter reports that the doctors have been doing a good job explaining things to them. They know that she has lung cancer, however, she has difficulty talking about this due to the fact that she becomes upset thinking about it. She has undergone 9 out of 10 radiation treatments but is having side effects and is not planning to complete the last treatment at this time. She also was evaluated by medical oncology but is not pursuing any chemotherapy. She has been trying to focus her care on interventions that will likely  help her feel as good as possible for as long as possible.  The patient then asked what the best way would be to work on getting her home.  We discussed that the hospital can be useful as long as she is getting well enough from care she receives at the hospital to enjoy her time at home, we may have reached a point where, if her goal is to be at home, she may be better served to plan on being at home and bringing care to her at home rather repeated trips to the hospital. We discussed hospice as a tool that may be benand this goal as we reach  a point where we are trying to fix problems that are not fixable. She reports that her husband was enrolled in hospice and this is the type of care that she desires for herself as well as this point in time.  She states that her goal after leaving the hospital is to return to her own home.  We talked about possibilities of trying to discharge home with hospice support as soon as possible versus continuing with current plan to see if medical oncology/radiation oncology/pulmonary team have recommendation for further treatment of pleural effusion or lung cancer that will add to her quality of life.  The patient, her son, and her daughter would like time to consider options we have discussed. They are in agreement with planning to meet again tomorrow at 11:30 AM to continue discussion. At this time, it  appears that the eventual plan will most likely be discharge home with hospice support.   Contacts/Participants in Discussion: Primary Decision Maker: Patient in conjunction with her children   HCPOA: None on chart   Code Status/Advance Care Planning:  Full code.  We discussed that in light of her acute worsening and underlying lung cancer, care should be focused on interventions that are likely to allow the patient to achieve goal of getting back to home and spending time with family. I discussed with the patient and her family family regarding heroic interventions at  the end-of-life.  While they agree this would not be in line with prior expressed wishes for a natural death or be likely to lead to getting well enough to go back home, they would like to discuss further as a family, including her other son, prior to making any decisions.  Plan to reconvene tomorrow morning at 11:30AM.   Symptom Management:   Dyspnea: Discussed management of dyspnea with the patient and recommended that we consider initiation of low-dose opioid medication to see if this helps with her symptoms. She is resistant to this at this time, and reports that she will need the night to think about it. I told I would be happy to make an as needed medication available for her use in case her symptoms worsen. She stated that she would not like for me to do this today and wants to talk about it further tomorrow during our follow-up meeting. Psycho-social/Spiritual:   Support System: Strong through family  Desire for further Chaplaincy support:no  Prognosis: < 6 months  Discharge Planning:  Home with Hospice       Chief Complaint/History of Present Illness:  81 year old female with squamous cell lung cancer admitted following progressive dyspnea and found to have left pleural effusion and left lower lobe consolidation  Primary Diagnoses  Present on Admission:  . (Resolved) Atrial fibrillation with RVR ([place]) . Primary cancer of left upper lobe of lung ([place]) . Acute respiratory failure with hypoxia ([place]) . Sepsis due to pneumonia ([place]) . Anemia of chronic disease . Acute hyponatremia . Recurrent pleural effusion on left . Squamous cell carcinoma lung ([place]) . PAT (paroxysmal atrial tachycardia) ([facility])  Palliative Review of Systems: Endorses dyspnea I have reviewed the medical record, interviewed the patient and family, and examined the patient. The following aspects are pertinent.  Past Medical History  Diagnosis Date  . Cataract     bilateral  . H/O hernia repair   . Lung  cancer ([place])     suspected stage III non small cell lung cancer    Social History   Social History  . Marital Status: Married    Spouse Name: N/A  . Number of Children: N/A  . Years of Education: N/A   Social History Main Topics  . Smoking status: Former Smoker -- 0.50 packs/day for 10 years    Quit date: 11/27/1972  . Smokeless tobacco: Never Used  . Alcohol Use: No  . Drug Use: No  . Sexual Activity: No   Other Topics Concern  . None   Social History Narrative   Family History  Problem Relation Age of Onset  . Stroke Father   . Cancer Sister   . Cancer Brother    Scheduled Meds: . enoxaparin (LOVENOX) injection  40 mg Subcutaneous Q24H  . feeding supplement (ENSURE ENLIVE)  237 mL Oral BID BM  . guaiFENesin  600 mg Oral BID  . levalbuterol  0.63 mg Nebulization  Q6H  . metoprolol  100 mg Oral BID  . piperacillin-tazobactam (ZOSYN)  IV  3.375 g Intravenous 3 times per day  . [START ON 03/02/2015] predniSONE  40 mg Oral Q breakfast  . sodium chloride  500 mL Intravenous Once  . [START ON 03/02/2015] vancomycin  750 mg Intravenous Q12H   Continuous Infusions:  PRN Meds:.acetaminophen, LORazepam, metoprolol, ondansetron **OR** ondansetron (ZOFRAN) IV Medications Prior to Admission:  Prior to Admission medications   Medication Sig Start Date End Date Taking? Authorizing Provider  acetaminophen (TYLENOL) 500 MG tablet Take 1,000 mg by mouth every 6 (six) hours as needed for moderate pain.   Yes Historical Provider, MD  acetaminophen (TYLENOL) 650 MG CR tablet Take 650 mg by mouth every 8 (eight) hours as needed for pain.   Yes Historical Provider, MD  Bisacodyl (LAXATIVE PO) Take 1 tablet by mouth daily as needed (contstipation).   Yes Historical Provider, MD  guaiFENesin-codeine 100-10 MG/5ML syrup Take 10 mLs by mouth every 4 (four) hours as needed for cough. 01/24/15  Yes Thurnell Lose, MD  LORazepam (ATIVAN) 0.5 MG tablet Take 0.5 mg by mouth 2 (two) times daily as  needed for anxiety.  11/30/14  Yes Historical Provider, MD  metoprolol (LOPRESSOR) 50 MG tablet Take 1 tablet (50 mg total) by mouth 2 (two) times daily. 01/24/15  Yes Thurnell Lose, MD  Multiple Vitamins-Minerals (CENTRUM ADULTS PO) Take 1 tablet by mouth daily.    Yes Historical Provider, MD   No Known Allergies CBC:    Component Value Date/Time   WBC 14.2* 02/28/2015 0821   WBC 9.7 12/24/2014 1450   HGB 9.7* 02/28/2015 0821   HGB 10.6* 12/24/2014 1450   HCT 30.1* 02/28/2015 0821   HCT 33.5* 12/24/2014 1450   PLT 447* 02/28/2015 0821   PLT 408* 12/24/2014 1450   MCV 89.6 02/28/2015 0821   MCV 88.4 12/24/2014 1450   NEUTROABS 9.5* 02/26/2015 1710   NEUTROABS 6.5 12/24/2014 1450   LYMPHSABS 0.5* 02/26/2015 1710   LYMPHSABS 2.0 12/24/2014 1450   MONOABS 1.4* 02/26/2015 1710   MONOABS 0.9 12/24/2014 1450   EOSABS 0.1 02/26/2015 1710   EOSABS 0.3 12/24/2014 1450   BASOSABS 0.0 02/26/2015 1710   BASOSABS 0.0 12/24/2014 1450   Comprehensive Metabolic Panel:    Component Value Date/Time   NA 139 02/28/2015 0821   NA 137 12/24/2014 1450   K 3.5 02/28/2015 0821   K 4.8 12/24/2014 1450   CL 104 02/28/2015 0821   CO2 28 02/28/2015 0821   CO2 29 12/24/2014 1450   BUN 14 02/28/2015 0821   BUN 13.0 12/24/2014 1450   CREATININE 0.56 02/28/2015 0821   CREATININE 0.7 12/24/2014 1450   GLUCOSE 135* 02/28/2015 0821   GLUCOSE 95 12/24/2014 1450   CALCIUM 8.4* 02/28/2015 0821   CALCIUM 9.2 12/24/2014 1450   AST 20 02/27/2015 0345   AST 10 12/24/2014 1450   ALT 31 02/27/2015 0345   ALT 11 12/24/2014 1450   ALKPHOS 102 02/27/2015 0345   ALKPHOS 116 12/24/2014 1450   BILITOT 0.3 02/27/2015 0345   BILITOT 0.24 12/24/2014 1450   PROT 6.5 02/27/2015 0345   PROT 7.3 12/24/2014 1450   ALBUMIN 2.5* 02/27/2015 0345   ALBUMIN 3.1* 12/24/2014 1450    Physical Exam: Vital Signs: BP 140/78 mmHg  Pulse 96  Temp(Src) 97.3 F (36.3 C) (Oral)  Resp 28  Ht _0  (1.651 m)  Wt 72.7 kg  (160 lb 4.4 oz)  BMI 26.67 kg/m2  SpO2 94% SpO2: SpO2: 94 % O2 Device: O2 Device: Nasal Cannula O2 Flow Rate: O2 Flow Rate (L/min): 4 L/min Intake/output summary:  Intake/Output Summary (Last 24 hours) at 03/01/15 2016 Last data filed at 03/01/15 1801  Gross per 24 hour  Intake    680 ml  Output   2526 ml  Net  -1846 ml   LBM: Last BM Date: 03/01/15 Baseline Weight: Weight: 69.854 kg (154 lb) Most recent weight: Weight: 72.7 kg (160 lb 4.4 oz)  Exam Findings:   General: Pt is alert and cooperative, no acute distress  HEENT: Mucous membrane moist, no scleral icterus   Cardiovascular: Irregular rate and rhythm, no rubs, no gallops  Respiratory: Rhonchi on the left side, clear right side, very diminished breaths sounds on the left side   Abdomen: Soft, non tender, non distended, bowel sounds present, no guarding         Palliative Performance Scale: 50               Additional Data Reviewed: Recent Labs     02/27/15  0345  02/28/15  0821  WBC  9.2  14.2*  HGB  9.5*  9.7*  PLT  387  447*  NA  134*  139  BUN  10  14  CREATININE  0.46  0.56     Time In: 1255 Time Out: 1415 Time Total: 80 Greater than 50%  of this time was spent counseling and coordinating care related to the above assessment and plan.  Signed by: Micheline Rough, MD  Micheline Rough, MD  03/01/2015, 8:16 PM  Please contact Palliative Medicine Team phone at [phone] for questions and concerns.

## 2015-03-01 NOTE — Progress Notes (Signed)
Brief pharmacy antibiotic note:  For complete details please see pharmacist Denita Lung note from earlier today.  Vanc trough=7 (subtherapeutic)  Plan: Increase vancomycin to '750mg'$  IV q12h Follow clinical course, renal function, culture results as available Repeat vanc trough as needed  Dolly Rias RPh 03/01/2015, 7:18 PM Pager [phone]

## 2015-03-01 NOTE — Progress Notes (Signed)
Date: March 01, 2015 Chart reviewed for concurrent status and case management needs. Will continue to follow patient for changes and needs: Velva Harman, RN, BSN, [place]   [phone]

## 2015-03-01 NOTE — Progress Notes (Addendum)
ANTIBIOTIC CONSULT NOTE - Follow up  Pharmacy Consult for Vanc, Zosyn Indication: pneumonia  No Known Allergies  Patient Measurements: Height: '5\' 5"'$  (165.1 cm) Weight: 160 lb 4.4 oz (72.7 kg) IBW/kg (Calculated) : 57  Vital Signs: Temp: 98.1 F (36.7 C) (10/14 0400) Temp Source: Oral (10/14 0000) BP: 146/72 mmHg (10/14 0805) Pulse Rate: 96 (10/13 2118) Intake/Output from previous day: 10/13 0701 - 10/14 0700 In: 1072 [IV Piggyback:352] Out: 3226 [Urine:3225; Stool:1] Intake/Output from this shift:    Labs:  Recent Labs  02/26/15 1626 02/26/15 1710 02/27/15 0345 02/28/15 0821  WBC  --  11.5* 9.2 14.2*  HGB  --  9.3* 9.5* 9.7*  PLT  --  392 387 447*  CREATININE 0.53  --  0.46 0.56   Estimated Creatinine Clearance: 51.4 mL/min (by C-G formula based on Cr of 0.56). No results for input(s): VANCOTROUGH, VANCOPEAK, VANCORANDOM, GENTTROUGH, GENTPEAK, GENTRANDOM, TOBRATROUGH, TOBRAPEAK, TOBRARND, AMIKACINPEAK, AMIKACINTROU, AMIKACIN in the last 72 hours.   Microbiology: Recent Results (from the past 720 hour(s))  Culture, blood (routine x 2)     Status: None (Preliminary result)   Collection Time: 02/26/15  4:24 PM  Result Value Ref Range Status   Specimen Description BLOOD RIGHT ANTECUBITAL  Final   Special Requests BOTTLES DRAWN AEROBIC AND ANAEROBIC 5CC EACH  Final   Culture   Final    NO GROWTH 2 DAYS Performed at [facility]    Report Status PENDING  Incomplete  Culture, blood (routine x 2)     Status: None (Preliminary result)   Collection Time: 02/26/15  7:00 PM  Result Value Ref Range Status   Specimen Description BLOOD LEFT WRIST  Final   Special Requests BOTTLES DRAWN AEROBIC AND ANAEROBIC 5CC EACH  Final   Culture   Final    NO GROWTH 2 DAYS Performed at [facility]    Report Status PENDING  Incomplete  MRSA PCR Screening     Status: None   Collection Time: 02/26/15 10:30 PM  Result Value Ref Range Status   MRSA by PCR NEGATIVE  NEGATIVE Final    Comment:        The GeneXpert MRSA Assay (FDA approved for NASAL specimens only), is one component of a comprehensive MRSA colonization surveillance program. It is not intended to diagnose MRSA infection nor to guide or monitor treatment for MRSA infections.   Culture, body fluid-bottle     Status: None (Preliminary result)   Collection Time: 02/27/15 11:19 AM  Result Value Ref Range Status   Specimen Description FLUID PLEURAL LEFT  Final   Special Requests BAA 10CCS  Final   Culture   Final    NO GROWTH 1 DAY Performed at [facility]    Report Status PENDING  Incomplete  Gram stain     Status: None   Collection Time: 02/27/15 11:19 AM  Result Value Ref Range Status   Specimen Description FLUID PLEURAL LEFT  Final   Special Requests NONE  Final   Gram Stain   Final    MODERATE WBC PRESENT,BOTH PMN AND MONONUCLEAR NO ORGANISMS SEEN Performed at [facility]    Report Status 02/27/2015 FINAL  Final    Medical History: Past Medical History  Diagnosis Date  . Cataract     bilateral  . H/O hernia repair   . Lung cancer ([place])     suspected stage III non small cell lung cancer     Medications:  Anti-infectives  Start     Dose/Rate Route Frequency Ordered Stop   02/27/15 0600  piperacillin-tazobactam (ZOSYN) IVPB 3.375 g     3.375 g 12.5 mL/hr over 240 Minutes Intravenous 3 times per day 02/26/15 2113     02/27/15 0600  vancomycin (VANCOCIN) 500 mg in sodium chloride 0.9 % 100 mL IVPB     500 mg 100 mL/hr over 60 Minutes Intravenous Every 12 hours 02/26/15 2113     02/26/15 1715  vancomycin (VANCOCIN) IVPB 1000 mg/200 mL premix     1,000 mg 200 mL/hr over 60 Minutes Intravenous  Once 02/26/15 1710 02/26/15 1856   02/26/15 1715  piperacillin-tazobactam (ZOSYN) IVPB 3.375 g  Status:  Discontinued     3.375 g 12.5 mL/hr over 240 Minutes Intravenous  Once 02/26/15 1710 02/27/15 1526     Assessment: 81 y.o. female with PMH - NSCLC  on rads sent from [place] for worsening SOB/cough.  CXR shows small to moderate left pleural effusion and LLL consolidation vs atelectasis with mild RLL airspace disease.  Pharmacy to begin vancomycin/Zosyn for HCAP.    10/11 >> Vanc >> 10/11 >> Zosyn >>    10/11 blood: ngtd 10/11 S. pneumo UAg: neg 10/11 Legionella UAg: IP 10/12 pleural fluid: ngtd  Today, 03/01/2015: Afebrile WBC increased (steroids) SCr stable, CrCl ~57m/min  Goal of Therapy:  Vancomycin trough level 15-20 mcg/ml  Appropriate antibiotic dosing for renal function; eradication of infection  Plan:  Day 4 antibiotics Cont Vanc '500mg'$  IV q12h. Cont Zosyn 3.375g IV Q8H infused over 4hrs. Check a Vanc trough before this evening's dose.  Follow clinical course, renal function, culture results as available. Follow for de-escalation of antibiotics and LOT.  TRomeo Rabon PharmD, pager [phone] 03/01/2015,8:52 AM.

## 2015-03-01 NOTE — Consult Note (Signed)
CARDIOLOGY CONSULT NOTE   Patient ID: GENIENE LIST MRN: [id number] DOB/AGE: [birth year] 79 y.o.  Admit date: 02/26/2015  Primary Physician   Simona Huh, MD Primary Cardiologist   New Reason for Consultation   Afib/flutter with RVR  HPI: HOLLEY KOCUREK is a 81 y.o. female with a history of NSCLC (stageIII) in LUL lung undergoing radiation treatments and no prior cardiac history who presented to [facility] ED with 1-2 weeks of progressively worsening dyspnea. She was admitted for L pleural effusion and possible PNA. She was noted to have atrial fib/flutter vs sinus tach on tele and cardiology consulted.   Her dyspnea was initially present with exertion but had progressed to dyspnea at rest in the 24 - 48 hours prior to presentation. She also had mixed episodes of non productive and productive cough of yellow phlegm and subjective fevers, chills. The patient was seen at the cancer center on 02/26/15 for radiation treatment and was noted to have large pleural effusion on the on the CT scan and was sent to the ED for further evaluation. In the ED patient was found to be tachycardic with sinus tachycardia and heart rate of 130s to 140s as well as hypoxic with oxygen saturation 87% on RA, RR up to 39, CXR notable for moderate left pleural effusion and LLL consolidation concerning for PNA.  The patient is felt to have acute respiratory failure due to lung cancer, worsening L pleural effusion and possible LLL PNA. Patient met criteria for sepsis and was started on broad spectrum abx and admitted to SDU. Throughout her stay she has had sinus tachycardia with some episodes worrisome for atrial fib/flutter. Of note she was admitted in early September of this year for hemoptysis and started on Lopressor 37m BID for sinus tachycardia. She denies a history of HTN, DM, CVA or CAD. She was in pretty remarkable health until January of this year when she was diagnosed with lung cancer. She denies  palpitations or syncope. She does not feel her heart fluttering. She does get chest pain from time to time that is left sided and fleeting. It is a pressure that is not associated with radiation, worsening SOB, diaphoresis or nausea. She currently does not have any chest pain.    Past Medical History  Diagnosis Date  . Cataract     bilateral  . H/O hernia repair   . [place]     suspected stage III non small cell lung cancer      Past Surgical History  Procedure Laterality Date  . Abdominal hysterectomy    . Video bronchoscopy Bilateral 12/04/2014    Procedure: VIDEO BRONCHOSCOPY WITH FLUORO;  Surgeon: RRigoberto Noel MD;  Location: [facility]  Service: Cardiopulmonary;  Laterality: Bilateral;  . Cataract extraction Bilateral   . Bladder suspension    . Hernia repair      No Known Allergies  I have reviewed the patient's current medications . enoxaparin (LOVENOX) injection  40 mg Subcutaneous Q24H  . feeding supplement (ENSURE ENLIVE)  237 mL Oral BID BM  . guaiFENesin  600 mg Oral BID  . levalbuterol  0.63 mg Nebulization Q6H  . methylPREDNISolone (SOLU-MEDROL) injection  60 mg Intravenous Q24H  . metoprolol  75 mg Oral BID  . piperacillin-tazobactam (ZOSYN)  IV  3.375 g Intravenous 3 times per day  . sodium chloride  500 mL Intravenous Once  . vancomycin  500 mg Intravenous Q12H     acetaminophen, LORazepam,  metoprolol, ondansetron **OR** ondansetron (ZOFRAN) IV  Prior to Admission medications   Medication Sig Start Date End Date Taking? Authorizing Provider  acetaminophen (TYLENOL) 500 MG tablet Take 1,000 mg by mouth every 6 (six) hours as needed for moderate pain.   Yes Historical Provider, MD  acetaminophen (TYLENOL) 650 MG CR tablet Take 650 mg by mouth every 8 (eight) hours as needed for pain.   Yes Historical Provider, MD  Bisacodyl (LAXATIVE PO) Take 1 tablet by mouth daily as needed (contstipation).   Yes Historical Provider, MD  guaiFENesin-codeine  100-10 MG/5ML syrup Take 10 mLs by mouth every 4 (four) hours as needed for cough. 01/24/15  Yes Thurnell Lose, MD  LORazepam (ATIVAN) 0.5 MG tablet Take 0.5 mg by mouth 2 (two) times daily as needed for anxiety.  11/30/14  Yes Historical Provider, MD  metoprolol (LOPRESSOR) 50 MG tablet Take 1 tablet (50 mg total) by mouth 2 (two) times daily. 01/24/15  Yes Thurnell Lose, MD  Multiple Vitamins-Minerals (CENTRUM ADULTS PO) Take 1 tablet by mouth daily.    Yes Historical Provider, MD     Social History   Social History  . Marital Status: Married    Spouse Name: N/A  . Number of Children: N/A  . Years of Education: N/A   Occupational History  . Not on file.   Social History Main Topics  . Smoking status: Former Smoker -- 0.50 packs/day for 10 years    Quit date: 11/27/1972  . Smokeless tobacco: Never Used  . Alcohol Use: No  . Drug Use: No  . Sexual Activity: No   Other Topics Concern  . Not on file   Social History Narrative    No family status information on file.   Family History  Problem Relation Age of Onset  . Stroke Father   . Cancer Sister   . Cancer Brother      ROS:  Full 14 point review of systems complete and found to be negative unless listed above.  Physical Exam: Blood pressure 146/72, pulse 96, temperature 97.9 F (36.6 C), temperature source Oral, resp. rate 25, height 5' 5"  (1.651 m), weight 160 lb 4.4 oz (72.7 kg), SpO2 91 %.  General: Well developed, well nourished, female in no acute distress Head: Eyes PERRLA, No xanthomas.   Normocephalic and atraumatic, oropharynx without edema or exudate. Lungs: mild wheezes. Decreased breath sounds on left. Heart: HRRR S1 S2, no rub/gallop, Heart irregular rate and rhythm with S1, S2  murmur. pulses are 2+ extrem.  Tachycardic  Neck: No carotid bruits. No lymphadenopathy.  No JVD. Abdomen: Bowel sounds present, abdomen soft and non-tender without masses or hernias noted. Msk:  No spine or cva tenderness. No  weakness, no joint deformities or effusions. Extremities: No clubbing or cyanosis. No LE edema.  Neuro: Alert and oriented X 3. No focal deficits noted. Psych:  Good affect, responds appropriately Skin: No rashes or lesions noted.  Labs:  Lab Results  Component Value Date   WBC 14.2* 02/28/2015   HGB 9.7* 02/28/2015   HCT 30.1* 02/28/2015   MCV 89.6 02/28/2015   PLT 447* 02/28/2015   No results for input(s): INR in the last 72 hours.  Recent Labs Lab 02/27/15 0345 02/28/15 0821  NA 134* 139  K 4.2 3.5  CL 103 104  CO2 26 28  BUN 10 14  CREATININE 0.46 0.56  CALCIUM 8.3* 8.4*  PROT 6.5  --   BILITOT 0.3  --  ALKPHOS 102  --   ALT 31  --   AST 20  --   GLUCOSE 169* 135*  ALBUMIN 2.5*  --    MAGNESIUM  Date Value Ref Range Status  02/26/2015 1.9 1.7 - 2.4 mg/dL Final    Echo: 02/27/2015 LV EF: 60% -  65% Study Conclusions - Left ventricle: The cavity size was normal. Systolic function was normal. The estimated ejection fraction was in the range of 60% to 65%. Wall motion was normal; there were no regional wall motion abnormalities. There was an increased relative contribution of atrial contraction to ventricular filling. Doppler parameters are consistent with abnormal left ventricular relaxation (grade 1 diastolic dysfunction). - Aortic valve: Moderately calcified annulus. Trileaflet; normal thickness leaflets. - Mitral valve: Calcified annulus. Mildly thickened leaflets . There was mild regurgitation. Valve area by continuity equation (using LVOT flow): 2.34 cm^2. - Tricuspid valve: There was trivial regurgitation. - Pulmonary arteries: PA peak pressure: 32 mm Hg (S).   ECG:  HR 138 Sinus tachy LAFB  Radiology:  Dg Chest 1 View  02/27/2015  CLINICAL DATA:  Left pleural effusion.  Status post thoracentesis. EXAM: CHEST 1 VIEW COMPARISON:  Chest x-ray and chest CT dated 02/26/2015 FINDINGS: There has been slight reduction in the left  pleural effusion. Large left upper lobe mass appears unchanged. No pneumothorax. Right lung is clear. Chronic posterior right diaphragmatic hernia containing only fat as demonstrated on the prior CT scan. Pulmonary vascularity is normal.  Heart size is normal. IMPRESSION: No complicating features after left thoracentesis. Slight decrease in the pleural effusion. No pneumothorax. Electronically Signed   By: Lorriane Shire M.D.   On: 02/27/2015 12:02   Dg Chest Port 1 View  03/01/2015  CLINICAL DATA:  Shortness of Breath EXAM: PORTABLE CHEST 1 VIEW COMPARISON:  February 27, 2015 chest radiograph and chest CT February 26, 2015 FINDINGS: There has been reaccumulation of pleural fluid on the left. There is opacification of most of the left hemithorax due to a combination of the large left upper lobe mass, effusion, and associated atelectasis/consolidation. In the right lung base, there is airspace consolidation which is slightly increased compared to recent prior study. There is stable elevation left hemidiaphragm. Heart is prominent but stable. The prominence in the left hilar region is stable, due primarily to the mass with associated adenopathy. No new adenopathy compared to recent prior study. Bones are osteoporotic. IMPRESSION: Opacification of most of the left hemithorax trauma due to reaccumulation of pleural fluid associated atelectasis/ consolidation. Large left upper lobe mass contributes to this opacification as does volume loss on the left with elevation of the left hemidiaphragm. There is increase in airspace disease in the right base, most likely pneumonia. No change in cardiac silhouette. Electronically Signed   By: Lowella Grip III M.D.   On: 03/01/2015 07:22   US Thoracentesis Asp Pleural Space W/img Guide  02/27/2015  INDICATION: Left lung cancer, dyspnea, left pleural effusion. Request is made for diagnostic and therapeutic left thoracentesis. EXAM: ULTRASOUND GUIDED DIAGNOSTIC AND  THERAPEUTIC LEFT THORACENTESIS COMPARISON:  None. MEDICATIONS: None COMPLICATIONS: None immediate TECHNIQUE: Informed written consent was obtained from the patient after a discussion of the risks, benefits and alternatives to treatment. A timeout was performed prior to the initiation of the procedure. Initial ultrasound scanning demonstrates a moderate left pleural effusion. The lower chest was prepped and draped in the usual sterile fashion. 1% lidocaine was used for local anesthesia. An ultrasound image was saved for documentation purposes. A  6 Fr Safe-T-Centesis catheter was introduced. The thoracentesis was performed. The catheter was removed and a dressing was applied. The patient tolerated the procedure well without immediate post procedural complication. The patient was escorted to have an upright chest radiograph. FINDINGS: A total of approximately 500 cc of hazy, yellow fluid was removed. Requested samples were sent to the laboratory. IMPRESSION: Successful ultrasound-guided diagnostic and therapeutic left sided thoracentesis yielding 500 cc of pleural fluid. Read by: Rowe Robert, PA-C Electronically Signed   By: Markus Daft M.D.   On: 02/27/2015 11:24    ASSESSMENT AND PLAN:    Principal Problem:   Acute respiratory failure with hypoxia (HCC) Active Problems:   Atrial fibrillation with RVR (HCC)   Primary cancer of left upper lobe of lung (HCC)   Sepsis due to pneumonia (HCC)   Anemia of chronic disease   Acute hyponatremia   Recurrent pleural effusion on left   Squamous cell carcinoma lung (HCC)  IVAH GIRARDOT is a 81 y.o. female with a history of NSCLC (stageIII) in LUL lung undergoing radiation treatments and no prior cardiac history who presented to [facility] ED with 1-2 weeks of progressively worsening dyspnea. She was admitted for L pleural effusion and possible PNA. She was noted to have atrial fib/flutter vs sinus tach on tele and cardiology consulted.   New onset Afib with RVR- I am  not convinced this is afib. Will have MD review tele. ECG reveals sinus tachycardia. Regardless, I am not sure she is an anticoagulation candidate with advanced lung cancer and recent hemoptysis (admission for this 01/2015).  -- CHADSVASC score of 3 (1 female sex and 2 for age). HgA1c 6.1 in 01/2015 -- 2D ECHO yesterday with EF 60-65%, no RWMA, G1DD, mild MR. PA pressure 32 mm Hg -- She remains tachycardic (looks like sinus right now). Will increase home Lopressor from 73m to 742mBID as her BP is mildly elevated.   Signed: Crista Luria0/14/2016 9:34 AM  Pager [phone]-Sign MD  History and all data above reviewed.  Patient examined.  I agree with the findings as above.  All telemetry strips reviewed.  No evidence of atrial fib.  She does have SVT which looks like an atrial tach and MAT.  She does not feel this.  The patient exam reveals COR:RRR  ,  Lungs: Decreased breath sounds  ,  Abd: Positive bowel sounds, no rebound no guarding, Ext  No edema .  All available labs, radiology testing, previous records reviewed. Agree with documented assessment and plan.  Tachycardia:  Secondary to the underlying illnesses.  Agree with increased beta blocker as her BP is elevated as well.  We could also consider adding a calcium channel blocker if she continues to have tachyarrhythmia.  However, the real treatment is to treat any underlying acute issue such as sepsis.  No indication for anticoagulation.    JaJeneen Rinksochrein  11:39 AM  03/01/2015

## 2015-03-01 NOTE — Progress Notes (Addendum)
Patient ID: Lindsey Fleming, female   DOB: 11-Nov-1927, 81 y.o.   MRN: [id number]  [facility] PROGRESS NOTE  Lindsey Fleming CHE:[id number] DOB: [birth year] DOA: 02/26/2015 PCP: Simona Huh, MD   Brief narrative:    81 year old female with known Sq Cell LUL lung cancer, currently on radiation treatments, presented to [facility] ED with main concern of 1-2 weeks duration of progressively worsening dyspnea that was initially present with exertion and has progressed to dyspnea at rest in the past 24 - 48 hours, associated with mixed episodes of non productive and productive cough of yellow phlegm, subjective fevers, chills.   In ED, pt noted to be hypoxic with oxygen saturation 87% on RA, RR up to 39, HR up to 150's, CXR notable for moderate left pleural effusion and LLL consolidation concerning for PNA. Pt started on Vanc and Zosyn and TRH asked to admit to SDU for further evaluation and management.   Major events since admission: 10/12 - left side thoracentesis with 500 cc fluid removed, no malignant cells noted per cytology report  10/13 - palliative care consulted  10/14 - still tachy and dyspneic, CXR with re accumulation of fluid, PCCM consulted   Assessment/Plan:    Principal Problem:   Acute respiratory failure with hypoxia ([place]) - appears to be multifactorial and secondary to known LUL lung cancer with associated left pleural effusion, LLL PNA - today is day #4 of Vanc and Zosyn, CXR 10/14 still with worsening pleural fluid and ? Worsening PNA vs radiation induced lung injury  - since no wheezing, will continue to taper down steroids, Prednisone 40 mg tomorrow and taper down by 10 mg until completed  - pt is s/p left thoracentesis, post op day #2, 500 cc fluid removed, fluid analysis with no malignant cells noted  - PCCM consulted for assistance - I have also consulted PCT for continuation of English discussions, pt is very clear she would like to stop radiation treatment and go  home if possible, I agree this is reasonable and will therefor see with PCT if we can arrange home d/c with hospice  - daughter still clear on keeping full code status and per last PCT meeting on last admission this was made clear, daughter is however, Ok with further [place] discussion with PCT   Active Problems:   Sepsis due to pneumonia ([place]) - pt meets criteria for sepsis with HR < 90 and RR > 22, with suspected source of LLL PNA - treated as HCAP for now given known lung cancer, repeat CXR today is notable for worsening pleural effusion and ? Worsening PNA, despite broad spectrum coverage, will discuss with PCCM  - follow up on sputum analysis and readjust the ABX regimen as clinically indicated - CBC still elevated this AM - would like to narrow down ABX as pt one episode of diarrhea this AM     Recurrent pleural effusion on left - no signs of malignant cells in fluid collected with thoracentesis on 10/12    Atrial fibrillation with RVR (HCC) - in sinus tachy this AM, likely reactive from lung issues  - continue Metoprolol 75 mg PO BID as well as inj as needed     Primary cancer of left upper lobe of lung ([place]), Sq Cell  Ca - will notify pt's radiation oncologist of pt's admission  - notified Dr. Julien Nordmann of pt's admission, will call this AM     Anemia of chronic disease - of malignancy  - no signs of  bleeding - CBC pending    Acute hyponatremia - likely pre renal in etiology - resolved   DVT prophylaxis - Lovenox SQ  Code Status: Full.  Family Communication:  plan of care discussed with the patient, daughter at bedside  Disposition Plan: Home when stable.   IV access:  Peripheral IV  Procedures and diagnostic studies:     Ct Angio Chest Pe W/cm &/or Wo Cm 02/26/2015  Motion degraded images. No evidence of pulmonary embolism to the lobar level. 9.8 x 7.8 cm left upper lobe mass, corresponding to known primary bronchogenic neoplasm, decreased. Small to moderate left pleural  effusion, increased. Associated compressive atelectasis and suspected radiation changes in the left lower lobe and lingula. Mild mediastinal lymphadenopathy, grossly unchanged.   Dg Chest Portable 1 View 02/26/2015 New left pleural effusion and left lower lung consolidation/atelectasis. Mild right basilar atelectasis/airspace disease. Left upper lobe mass/malignancy again identified.   Medical Consultants:  IR PCCM Cardiology   Other Consultants:  None  IAnti-Infectives:   Vancomycin 10/11 --> Zosyn 10/11 -->  Faye Ramsay, MD  [facility] Pager [phone]  If 7PM-7AM, please contact night-coverage [email or web address] Password [email or web address] 03/01/2015, 10:25 AM   LOS: 3 days   HPI/Subjective: No events overnight. Still with dyspnea at rest.   Objective: Filed Vitals:   03/01/15 0756 03/01/15 0757 03/01/15 0805 03/01/15 0900  BP:   146/72   Pulse:      Temp:   97.9 F (36.6 C)   TempSrc:   Oral   Resp: 25 33 25   Height:      Weight:      SpO2: 95% 93% 99% 91%    Intake/Output Summary (Last 24 hours) at 03/01/15 1025 Last data filed at 03/01/15 0659  Gross per 24 hour  Intake   1072 ml  Output   3226 ml  Net  -2154 ml    Exam:   General:  Pt is alert, in mld distress due to dyspnea   Cardiovascular: Irregular rate and rhythm, no rubs, no gallops  Respiratory: Rhonchi on the left side, clear right side, very diminished breaths sounds on the left side   Abdomen: Soft, non tender, non distended, bowel sounds present, no guarding  Data Reviewed: Basic Metabolic Panel:  Recent Labs Lab 02/26/15 1626 02/27/15 0345 02/28/15 0821  NA 131* 134* 139  K 4.9 4.2 3.5  CL 98* 103 104  CO2 '26 26 28  '$ GLUCOSE 114* 169* 135*  BUN '13 10 14  '$ CREATININE 0.53 0.46 0.56  CALCIUM 8.4* 8.3* 8.4*  MG 1.9  --   --   PHOS 3.0  --   --    Liver Function Tests:  Recent Labs Lab 02/26/15 1626 02/27/15 0345  AST 29 20  ALT 29 31  ALKPHOS 105 102  BILITOT 1.0 0.3  PROT 7.2  6.5  ALBUMIN 2.9* 2.5*   CBC:  Recent Labs Lab 02/26/15 1710 02/27/15 0345 02/28/15 0821  WBC 11.5* 9.2 14.2*  NEUTROABS 9.5*  --   --   HGB 9.3* 9.5* 9.7*  HCT 28.7* 29.6* 30.1*  MCV 88.0 90.0 89.6  PLT 392 387 447*   Recent Results (from the past 240 hour(s))  MRSA PCR Screening     Status: None   Collection Time: 02/26/15 10:30 PM  Result Value Ref Range Status   MRSA by PCR NEGATIVE NEGATIVE Final   Scheduled Meds: . enoxaparin  injection  40 mg Subcutaneous Q24H  . guaiFENesin  600 mg Oral BID  .  levalbuterol  0.63 mg Nebulization Q6H  . methylPREDNISolone    60 mg Intravenous Q6H  . metoprolol  50 mg Oral BID  . ZOSYN  IV  3.375 g Intravenous 3 times per day  . vancomycin  500 mg Intravenous Q12H   Continuous Infusions:

## 2015-03-02 DIAGNOSIS — J9601 Acute respiratory failure with hypoxia: Secondary | ICD-10-CM

## 2015-03-02 DIAGNOSIS — C3412 Malignant neoplasm of upper lobe, left bronchus or lung: Secondary | ICD-10-CM

## 2015-03-02 DIAGNOSIS — J90 Pleural effusion, not elsewhere classified: Secondary | ICD-10-CM

## 2015-03-02 DIAGNOSIS — Z515 Encounter for palliative care: Secondary | ICD-10-CM

## 2015-03-02 DIAGNOSIS — R0600 Dyspnea, unspecified: Secondary | ICD-10-CM

## 2015-03-02 LAB — CBC
HEMATOCRIT: 29.7 % — AB (ref 36.0–46.0)
HEMOGLOBIN: 9.2 g/dL — AB (ref 12.0–15.0)
MCH: 27.8 pg (ref 26.0–34.0)
MCHC: 31 g/dL (ref 30.0–36.0)
MCV: 89.7 fL (ref 78.0–100.0)
Platelets: 381 10*3/uL (ref 150–400)
RBC: 3.31 MIL/uL — ABNORMAL LOW (ref 3.87–5.11)
RDW: 15.2 % (ref 11.5–15.5)
WBC: 8.6 10*3/uL (ref 4.0–10.5)

## 2015-03-02 LAB — BASIC METABOLIC PANEL
ANION GAP: 5 (ref 5–15)
BUN: 10 mg/dL (ref 6–20)
CALCIUM: 7.8 mg/dL — AB (ref 8.9–10.3)
CHLORIDE: 98 mmol/L — AB (ref 101–111)
CO2: 33 mmol/L — AB (ref 22–32)
Creatinine, Ser: 0.61 mg/dL (ref 0.44–1.00)
GFR calc non Af Amer: >60 mL/min (ref >60)
GLUCOSE: 107 mg/dL — AB (ref 65–99)
POTASSIUM: 3.2 mmol/L — AB (ref 3.5–5.1)
Sodium: 136 mmol/L (ref 135–145)

## 2015-03-02 MED ORDER — LORAZEPAM 0.5 MG PO TABS: 0.5000 mg | ORAL_TABLET | Freq: Two times a day (BID) | ORAL | Status: AC | PRN

## 2015-03-02 MED ORDER — PREDNISONE 10 MG PO TABS: ORAL_TABLET | ORAL | Status: AC

## 2015-03-02 MED ORDER — METOPROLOL TARTRATE 50 MG PO TABS: 75.0000 mg | ORAL_TABLET | Freq: Two times a day (BID) | ORAL | Status: AC

## 2015-03-02 MED ORDER — SENNA 8.6 MG PO TABS: 1.0000 | ORAL_TABLET | Freq: Every day | ORAL | Status: DC | PRN

## 2015-03-02 MED ORDER — LEVALBUTEROL HCL 0.63 MG/3ML IN NEBU: 0.6300 mg | INHALATION_SOLUTION | Freq: Four times a day (QID) | RESPIRATORY_TRACT | Status: AC | PRN

## 2015-03-02 MED ORDER — DOCUSATE SODIUM 100 MG PO CAPS
100.0000 mg | ORAL_CAPSULE | Freq: Two times a day (BID) | ORAL | Status: DC
  Filled 2015-03-02 (×2): qty 1

## 2015-03-02 MED ORDER — MORPHINE SULFATE (CONCENTRATE) 10 MG /0.5 ML PO SOLN: 10.0000 mg | ORAL | Status: AC | PRN

## 2015-03-02 MED ORDER — BISACODYL 5 MG PO TBEC: 5.0000 mg | DELAYED_RELEASE_TABLET | Freq: Every day | ORAL | Status: DC | PRN

## 2015-03-02 MED ORDER — LEVOFLOXACIN 500 MG PO TABS
500.0000 mg | ORAL_TABLET | Freq: Every day | ORAL | Status: DC
  Administered 2015-03-02 – 2015-03-03 (×2): 500 mg via ORAL
  Filled 2015-03-02 (×2): qty 1

## 2015-03-02 MED ORDER — MORPHINE SULFATE 10 MG/5ML PO SOLN: 5.0000 mg | ORAL | Status: DC | PRN

## 2015-03-02 MED ORDER — POTASSIUM CHLORIDE CRYS ER 20 MEQ PO TBCR: 40.0000 meq | EXTENDED_RELEASE_TABLET | Freq: Once | ORAL | Status: DC

## 2015-03-02 MED ORDER — POTASSIUM CHLORIDE CRYS ER 20 MEQ PO TBCR
30.0000 meq | EXTENDED_RELEASE_TABLET | ORAL | Status: AC
  Administered 2015-03-02 (×2): 30 meq via ORAL
  Filled 2015-03-02 (×5): qty 1

## 2015-03-02 MED ORDER — LEVOFLOXACIN 500 MG PO TABS: 500.0000 mg | ORAL_TABLET | Freq: Every day | ORAL | Status: AC

## 2015-03-02 MED ORDER — PREDNISONE 5 MG PO TABS
30.0000 mg | ORAL_TABLET | Freq: Every day | ORAL | Status: DC
  Administered 2015-03-03: 30 mg via ORAL
  Filled 2015-03-02: qty 1
  Filled 2015-03-02: qty 2

## 2015-03-02 MED ORDER — POTASSIUM CHLORIDE CRYS ER 20 MEQ PO TBCR: 30.0000 meq | EXTENDED_RELEASE_TABLET | ORAL | Status: DC

## 2015-03-02 NOTE — Care Management Note (Addendum)
Case Management Note  Patient Details  Name: Lindsey Fleming MRN: [id number] Date of Birth: 30-Apr-1928  Subjective/Objective:          LUL lung cancer           Action/Plan: 03/02/2015 1431 pm NCM spoke to to pt and gave permission to speak to dtr, [name]. Provided dtr with Hospice list. Pt requested [facility]. States oxygen and nebulizer machine is needed for home. Pt has RW and 3n1 bedside commode at home. NCM contacted [facility] with new referral. Faxed orders, H&P, palliative care note and facesheet to [facility]. Waiting for call back.   03/02/2015 1530 Received call back from [facility] and they do not have admission availability for the weekend. [facility] can admit on 03/04/2015. NCM spoke to family and requested [facility]. Pt wants to dc home this weekend. [facility] [place] and spoke with Insurance risk surveyor. They can admit to service on 03/03/2015, and arrange oxygen and nebulizer.   Expected Discharge Date:  03/03/2015             Expected Discharge Plan:  Home w Hospice Care  In-House Referral:  Clinical Social Work  Discharge planning Services  CM Consult  Post Acute Care Choice:  Hospice Choice offered to:  Adult Children  DME Arranged:  Oxygen, Nebulizer machine DME Agency:  [facility] Arranged:  RN [facility] Agency:  [facility]  Status of Service:  Completed, signed off  Medicare Important Message Given:    Date Medicare IM Given:    Medicare IM give by:    Date Additional Medicare IM Given:    Additional Medicare Important Message give by:     If discussed at [place] Meetings, dates discussed:    Additional Comments:  Erenest Rasher, RN 03/02/2015, 2:31 PM

## 2015-03-02 NOTE — Discharge Instructions (Signed)
Respiratory failure is when your lungs are not working well and your breathing (respiratory) system fails. When respiratory failure occurs, it is difficult for your lungs to get enough oxygen, get rid of carbon dioxide, or both. Respiratory failure can be life threatening.  °Respiratory failure can be acute or chronic. Acute respiratory failure is sudden, severe, and requires emergency medical treatment. Chronic respiratory failure is less severe, happens over time, and requires ongoing treatment.  °WHAT ARE THE CAUSES OF ACUTE RESPIRATORY FAILURE?  °Any problem affecting the heart or lungs can cause acute respiratory failure. Some of these causes include the following: °· Chronic bronchitis and emphysema (COPD).   °· Blood clot going to a lung (pulmonary embolism).   °· Having water in the lungs caused by heart failure, lung injury, or infection (pulmonary edema).   °· Collapsed lung (pneumothorax).   °· Pneumonia.   °· Pulmonary fibrosis.   °· Obesity.   °· Asthma.   °· Heart failure.   °· Any type of trauma to the chest that can make breathing difficult.   °· Nerve or muscle diseases making chest movements difficult. °HOW WILL MY ACUTE RESPIRATORY FAILURE BE TREATED?  °Treatment of acute respiratory failure depends on the cause of the respiratory failure. Usually, you will stay in the intensive care unit so your breathing can be watched closely. Treatment can include the following: °· Oxygen. Oxygen can be delivered through the following: °¨ Nasal cannula. This is small tubing that goes in your nose to give you oxygen. °¨ Face mask. A face mask covers your nose and mouth to give you oxygen. °· Medicine. Different medicines can be given to help with breathing. These can include: °¨ Nebulizers. Nebulizers deliver medicines to open the air passages (bronchodilators). These medicines help to open or relax the airways in the lungs so you can breathe better. They can also help loosen mucus from your  lungs. °¨ Diuretics. Diuretic medicines can help you breathe better by getting rid of extra water in your body. °¨ Steroids. Steroid medicines can help decrease swelling (inflammation) in your lungs. °¨ Antibiotics. °· Chest tube. If you have a collapsed lung (pneumothorax), a chest tube is placed to help reinflate the lung. °· Noninvasive positive pressure ventilation (NPPV). This is a tight-fitting mask that goes over your nose and mouth. The mask has tubing that is attached to a machine. The machine blows air into the tubing, which helps to keep the tiny air sacs (alveoli) in your lungs open. This machine allows you to breathe on your own. °· Ventilator. A ventilator is a breathing machine. When on a ventilator, a breathing tube is put into the lungs. A ventilator is used when you can no longer breathe well enough on your own. You may have low oxygen levels or high carbon dioxide (CO2) levels in your blood. When you are on a ventilator, sedation and pain medicines are given to make you sleep so your lungs can heal. °SEEK IMMEDIATE MEDICAL CARE IF: °· You have shortness of breath (dyspnea) with or without activity. °· You have rapid breathing (tachypnea). °· You are wheezing. °· You are unable to say more than a few words without having to catch your breath. °· You find it very difficult to function normally. °· You have a fast heart rate. °· You have a bluish color to your finger or toe nail beds. °· You have confusion or drowsiness or both. °  °This information is not intended to replace advice given to you by your health care provider. Make sure you discuss   any questions you have with your health care provider. °  °Document Released: 05/09/2013 Document Revised: 01/23/2015 Document Reviewed: 05/09/2013 °Elsevier Interactive Patient Education ©2016 [employer] Inc[employer] ° °

## 2015-03-02 NOTE — Progress Notes (Signed)
I received call from Dr. Lake Bells following his meeting with Ms. Surratt and stopped by to check in on her and her family.  They are very thankful for the care she has received during her hospitalization.  She reports that she is ready to go home with the support of hospice and is not interested in any further procedures.  Plan is for discharge home with hospice support.  Dr. Lake Bells placed referral to case management to facilitate selection of home hospice agency.  Family has contact information for our team if we can be of further assistance to Ms. Bohan prior to her discharge.  Micheline Rough, MD [place] [facility] [phone]

## 2015-03-02 NOTE — Consult Note (Signed)
PULMONARY / CRITICAL CARE MEDICINE   Name: Lindsey Fleming MRN: [id number] DOB: 23-Jan-1928    ADMISSION DATE:  02/26/2015 CONSULTATION DATE:  03/02/2015  REFERRING MD :  Randell Loop  CHIEF COMPLAINT:  Shortness of breath  INITIAL PRESENTATION: 81 year old female with at least stage IIIB squamous cell carcinoma was admitted on 02/26/2015 with a pleural effusion causing dyspnea. Pulmonary and critical care medicine was consulted on 03/02/2015 with recurrent pleural effusion.  STUDIES:  02/26/2015 > CT angiogram chest > large left upper lobe mass with an effusion noted on that side  SIGNIFICANT EVENTS: 10/12 thoracentesis> ultrasound guided fluid removal left chest, 500cc removed   HISTORY OF PRESENT ILLNESS:  This is an 81 year old female who has likely metastatic squamous cell carcinoma of the left lung who was admitted on 02/26/2015 with increasing shortness of breath. Based on my review of the chart and imaging extensively it appears that she's had a left upper lobe mass since February 2016. She underwent a bronchoscopy in July 2016 which was nondiagnostic. She then underwent 2 more biopsies until she was finally diagnosed with squamous cell carcinoma after a transthoracic needle biopsy on 02/07/2015. Since then she has seen oncology and radiation oncology and has been treated with radiation therapy which what I believe is palliative intent. During this time she's had progressive shortness of breath and has not improved despite the radiation therapy. She's also had extensive cough with mucus production during this time as well. She was hospitalized several days ago because of increasing shortness of breath and she did have relief of her dyspnea with the thoracentesis. This morning she is feeling "a bit more clogged up" and having more cough with mucus production. She feels more short of breath this morning. She was noted to have increasing pleural fluid collection on her most recent chest  x-ray.  PAST MEDICAL HISTORY :   has a past medical history of Cataract; H/O hernia repair; and Lung cancer ([place]).  has past surgical history that includes Abdominal hysterectomy; Video bronchoscopy (Bilateral, 12/04/2014); Cataract extraction (Bilateral); Bladder suspension; and Hernia repair. Prior to Admission medications   Medication Sig Start Date End Date Taking? Authorizing Provider  acetaminophen (TYLENOL) 500 MG tablet Take 1,000 mg by mouth every 6 (six) hours as needed for moderate pain.   Yes Historical Provider, MD  acetaminophen (TYLENOL) 650 MG CR tablet Take 650 mg by mouth every 8 (eight) hours as needed for pain.   Yes Historical Provider, MD  Bisacodyl (LAXATIVE PO) Take 1 tablet by mouth daily as needed (contstipation).   Yes Historical Provider, MD  guaiFENesin-codeine 100-10 MG/5ML syrup Take 10 mLs by mouth every 4 (four) hours as needed for cough. 01/24/15  Yes Thurnell Lose, MD  LORazepam (ATIVAN) 0.5 MG tablet Take 0.5 mg by mouth 2 (two) times daily as needed for anxiety.  11/30/14  Yes Historical Provider, MD  metoprolol (LOPRESSOR) 50 MG tablet Take 1 tablet (50 mg total) by mouth 2 (two) times daily. 01/24/15  Yes Thurnell Lose, MD  Multiple Vitamins-Minerals (CENTRUM ADULTS PO) Take 1 tablet by mouth daily.    Yes Historical Provider, MD   No Known Allergies  FAMILY HISTORY:  has no family status information on file.  SOCIAL HISTORY:  reports that she quit smoking about 42 years ago. She has never used smokeless tobacco. She reports that she does not drink alcohol or use illicit drugs.  REVIEW OF SYSTEMS:   Gen: Denies fever, chills, weight change, + fatigue, night  sweats HEENT: Denies blurred vision, double vision, hearing loss, tinnitus, sinus congestion, rhinorrhea, sore throat, neck stiffness, dysphagia PULM: per HPI CV: Denies chest pain, edema, orthopnea, paroxysmal nocturnal dyspnea, palpitations GI: Denies abdominal pain, nausea, vomiting, diarrhea,  hematochezia, melena, constipation, change in bowel habits GU: Denies dysuria, hematuria, polyuria, oliguria, urethral discharge Endocrine: Denies hot or cold intolerance, polyuria, polyphagia or appetite change Derm: Denies rash, dry skin, scaling or peeling skin change Heme: Denies easy bruising, bleeding, bleeding gums Neuro: Denies headache, numbness, weakness, slurred speech, loss of memory or consciousness   SUBJECTIVE:   VITAL SIGNS: Temp:  [97.3 F (36.3 C)-98.3 F (36.8 C)] 97.9 F (36.6 C) (10/15 0800) Pulse Rate:  [98] 98 (10/14 2212) Resp:  [19-36] 28 (10/15 0645) BP: (112-162)/(42-81) 153/81 mmHg (10/15 0645) SpO2:  [89 %-97 %] 95 % (10/15 0932) Weight:  [70.5 kg (155 lb 6.8 oz)] 70.5 kg (155 lb 6.8 oz) (10/15 0333) HEMODYNAMICS:   VENTILATOR SETTINGS:   INTAKE / OUTPUT:  Intake/Output Summary (Last 24 hours) at 03/02/15 0934 Last data filed at 03/02/15 0600  Gross per 24 hour  Intake    840 ml  Output   2450 ml  Net  -1610 ml    PHYSICAL EXAMINATION: General:  Frail, chronically ill-appearing female in mild respiratory distress Neuro:  Awake alert, no distress HEENT:  Normocephalic atraumatic, oropharynx clear Cardiovascular:  Tachycardic, irregularly irregular Lungs:  Diminished left base, few crackles left lung, slight increase in respiratory effort but speaking in full sentences and not using accessory muscles Abdomen:  Bowel sounds positive, nontender nondistended Musculoskeletal:  Normal bulk and tone Skin:  No rash or skin breakdown, no leg edema  LABS:  CBC  Recent Labs Lab 02/27/15 0345 02/28/15 0821 03/02/15 0340  WBC 9.2 14.2* 8.6  HGB 9.5* 9.7* 9.2*  HCT 29.6* 30.1* 29.7*  PLT 387 447* 381   Coag's No results for input(s): APTT, INR in the last 168 hours. BMET  Recent Labs Lab 02/27/15 0345 02/28/15 0821 03/02/15 0340  NA 134* 139 136  K 4.2 3.5 3.2*  CL 103 104 98*  CO2 26 28 33*  BUN '10 14 10  '$ CREATININE 0.46 0.56 0.61   GLUCOSE 169* 135* 107*   Electrolytes  Recent Labs Lab 02/26/15 1626 02/27/15 0345 02/28/15 0821 03/02/15 0340  CALCIUM 8.4* 8.3* 8.4* 7.8*  MG 1.9  --   --   --   PHOS 3.0  --   --   --    Sepsis Markers No results for input(s): LATICACIDVEN, PROCALCITON, O2SATVEN in the last 168 hours. ABG  Recent Labs Lab 02/26/15 2110  PHART 7.325*  PCO2ART 45.0  PO2ART 96.3   Liver Enzymes  Recent Labs Lab 02/26/15 1626 02/27/15 0345  AST 29 20  ALT 29 31  ALKPHOS 105 102  BILITOT 1.0 0.3  ALBUMIN 2.9* 2.5*   Cardiac Enzymes No results for input(s): TROPONINI, PROBNP in the last 168 hours. Glucose No results for input(s): GLUCAP in the last 168 hours.  Imaging No results found.   ASSESSMENT / PLAN: Principal Problem:   Acute respiratory failure with hypoxia (HCC) Active Problems:   Primary cancer of left upper lobe of lung (HCC)   Sepsis due to pneumonia (HCC)   Anemia of chronic disease   Acute hyponatremia   Recurrent pleural effusion on left   Squamous cell carcinoma lung (HCC)   [name] (paroxysmal atrial tachycardia) (HCC)   Dyspnea   Palliative care encounter   PULMONARY  A: Stage IIIB squamous cell carcinoma of the lung with an associated pleural effusion. Even though the pathology from the fluid collection was negative I'm convinced that this is a malignant pleural effusion. It has rapidly reaccumulated and is now causing worsening respiratory distress. I had a lengthy conversation with the patient and her daughter today discussing the prognosis and options for care. Specifically, I explained to them both in length that given the rapid recurrence of the pleural effusion of think the best approach moving forward would be to perform a thoracentesis today and then place a Pleurx catheter on Monday which could be used at home. I also explained to them at length that this is an incurable cancer.  After lengthy discussion (45 minutes or more) the patient and her  daughter were both tearful and stated that they've been quite frustrated with all the procedures that they've been through over the last several months. The patient stated that she just wants to go home and not have any more procedures. At this point I again offered to perform a thoracentesis and Pleurx catheter again but she stated clearly that she did not want it.  P:   Case discussed with [facility] hospital[facility] and palliative medicine, the plan at this point will be to discharge home with home hospice. The patient does not desire further procedures.  Continue guaifenesin Continue oxygen as needed for comfort Would use morphine as needed for comfort, I have written an order for Roxanol solution every 3 hours when necessary I will also order a bowel regimen considering the fact that and starting opiates  Pulmonary and critical care medicine will sign off, call with questions.   Roselie Awkward, MD [place] [facility] Pager: [phone] Cell: [phone] After 3pm or if no response, call [phone]  03/02/2015, 9:34 AM

## 2015-03-02 NOTE — Clinical Social Work Note (Signed)
CSW met with patient and patient's family at bedside. Family and patient state that they will get home by personal vehicle and do not need ambulance transport. Patient and family state that oxygen and a nebulizer will be needed at home. Patient will also need oxygen during transport. RNCM updated. CSW signing off.   Liz Beach MSW, [place], [place], [phone]

## 2015-03-02 NOTE — Discharge Summary (Signed)
Physician Discharge Summary  Lindsey Fleming KMM:[id number] DOB: [birth year] DOA: 02/26/2015  PCP: Simona Huh, MD  Admit date: 02/26/2015 Discharge date: 03/02/2015  Recommendations for Outpatient Follow-up:  1. Pt will be discharged home with hospice, per pt's wishes   Discharge Diagnoses:  Principal Problem:   Acute respiratory failure with hypoxia ([place]) Active Problems:   Sepsis due to pneumonia ([place])   Recurrent pleural effusion on left   Squamous cell carcinoma lung (HCC)   Primary cancer of left upper lobe of lung (HCC)   Anemia of chronic disease   Acute hyponatremia   [name] (paroxysmal atrial tachycardia) (HCC)   Dyspnea   Palliative care encounter  Discharge Condition: Stable  Diet recommendation: Heart healthy diet discussed in details   Brief narrative:    81 year old female with known Sq Cell LUL lung cancer, currently on radiation treatments, presented to [facility] ED with main concern of 1-2 weeks duration of progressively worsening dyspnea that was initially present with exertion and has progressed to dyspnea at rest in the past 24 - 48 hours, associated with mixed episodes of non productive and productive cough of yellow phlegm, subjective fevers, chills.   In ED, pt noted to be hypoxic with oxygen saturation 87% on RA, RR up to 39, HR up to 150's, CXR notable for moderate left pleural effusion and LLL consolidation concerning for PNA. Pt started on Vanc and Zosyn and TRH asked to admit to SDU for further evaluation and management.   Major events since admission: 10/12 - left side thoracentesis with 500 cc fluid removed, no malignant cells noted per cytology report  10/13 - palliative care consulted  10/14 - still tachy and dyspneic, CXR with re accumulation of fluid, PCCM consulted   Assessment/Plan:    Principal Problem:  Acute respiratory failure with hypoxia ([place]) - appears to be multifactorial and secondary to known LUL lung cancer with  associated left pleural effusion, LLL PNA - CXR 10/14 still with worsening pleural fluid and ? Worsening PNA vs radiation induced lung injury  - continue to taper down steroids upon discharge, also continue Levaquin to complete therapy  - pt is s/p left thoracentesis, post op day #3, 500 cc fluid removed, fluid analysis with no malignant cells noted  - PCCM consulted for assistance and recommended another thoracentesis - pt has declined thoracentesis and opted to go home with hospice, respect wishes   Active Problems:  Sepsis due to pneumonia ([place]) - pt met criteria for sepsis with HR < 90 and RR > 22, with suspected source of LLL PNA - treated as HCAP for now given known lung cancer, repeat CXR is notable for worsening pleural effusion and ? Worsening PNA, despite broad spectrum coverage - complete ABX therapy as noted above and d/c home with hospice    Recurrent pleural effusion on left - no signs of malignant cells in fluid collected with thoracentesis on 10/12 - pt has declined repeat thoracentesis as recommended by PCCM    Atrial fibrillation with RVR ([place]) - in sinus tachy this AM, likely reactive from lung issues  - continue Metoprolol 75 mg PO BID    Primary cancer of left upper lobe of lung ([place]), Sq Cell Ca - will notify pt's radiation oncologist of pt's admission  - notified Dr. Julien Nordmann of pt's admission   Anemia of chronic disease - of malignancy  - no signs of bleeding   Acute hyponatremia - likely pre renal in etiology - resolved   Code Status: DNR  Family Communication: plan of care discussed with the patient, daughter at bedside  Disposition Plan: Home  IV access:  Peripheral IV  Procedures and diagnostic studies:    Ct Angio Chest Pe W/cm &/or Wo Cm 02/26/2015 Motion degraded images. No evidence of pulmonary embolism to the lobar level. 9.8 x 7.8 cm left upper lobe mass, corresponding to known primary bronchogenic neoplasm, decreased.  Small to moderate left pleural effusion, increased. Associated compressive atelectasis and suspected radiation changes in the left lower lobe and lingula. Mild mediastinal lymphadenopathy, grossly unchanged.   Dg Chest Portable 1 View 02/26/2015 New left pleural effusion and left lower lung consolidation/atelectasis. Mild right basilar atelectasis/airspace disease. Left upper lobe mass/malignancy again identified.   Medical Consultants:  [facility] PCCM Cardiology   Other Consultants:  None  IAnti-Infectives:   Vancomycin 10/11 --> 10/15 Zosyn 10/11 --> 10/15 Levaquin 10/15 -->      Discharge Exam: Filed Vitals:   03/02/15 1033  BP: 95/47  Pulse: 101  Temp:   Resp:    Filed Vitals:   03/02/15 0800 03/02/15 0840 03/02/15 0932 03/02/15 1033  BP:    95/47  Pulse:  130  101  Temp: 97.9 F (36.6 C)     TempSrc: Oral     Resp:      Height:      Weight:      SpO2:   95%     General: Pt is alert, follows commands appropriately, not in acute distress Cardiovascular: Regular rhythm, tachycardic, no rubs, no gallops Respiratory: Poor air movement on the left side  Abdominal: Soft, non tender, non distended, bowel sounds +, no guarding  Discharge Instructions  Discharge Instructions    Diet - low sodium heart healthy    Complete by:  As directed      Increase activity slowly    Complete by:  As directed             Medication List    TAKE these medications        acetaminophen 500 MG tablet  Commonly known as:  TYLENOL  Take 1,000 mg by mouth every 6 (six) hours as needed for moderate pain.     CENTRUM ADULTS PO  Take 1 tablet by mouth daily.     guaiFENesin-codeine 100-10 MG/5ML syrup  Take 10 mLs by mouth every 4 (four) hours as needed for cough.     LAXATIVE PO  Take 1 tablet by mouth daily as needed (contstipation).     levalbuterol 0.63 MG/3ML nebulizer solution  Commonly known as:  XOPENEX  Take 3 mLs (0.63 mg total) by nebulization every 6 (six)  hours as needed for wheezing or shortness of breath.     levofloxacin 500 MG tablet  Commonly known as:  LEVAQUIN  Take 1 tablet (500 mg total) by mouth daily.     LORazepam 0.5 MG tablet  Commonly known as:  ATIVAN  Take 1 tablet (0.5 mg total) by mouth 2 (two) times daily as needed for anxiety.     metoprolol 50 MG tablet  Commonly known as:  LOPRESSOR  Take 1.5 tablets (75 mg total) by mouth 2 (two) times daily.     morphine CONCENTRATE 10 mg / 0.5 ml concentrated solution  Take 0.5 mLs (10 mg total) by mouth every 4 (four) hours as needed for moderate pain, severe pain or shortness of breath.     predniSONE 10 MG tablet  Commonly known as:  DELTASONE  Take 30 mg tablet  today and taper down by 10 mg daily until completed          The results of significant diagnostics from this hospitalization (including imaging, microbiology, ancillary and laboratory) are listed below for reference.     Microbiology: Recent Results (from the past 240 hour(s))  Culture, blood (routine x 2)     Status: None (Preliminary result)   Collection Time: 02/26/15  4:24 PM  Result Value Ref Range Status   Specimen Description BLOOD RIGHT ANTECUBITAL  Final   Special Requests BOTTLES DRAWN AEROBIC AND ANAEROBIC 5CC EACH  Final   Culture   Final    NO GROWTH 3 DAYS Performed at [facility]    Report Status PENDING  Incomplete  Culture, blood (routine x 2)     Status: None (Preliminary result)   Collection Time: 02/26/15  7:00 PM  Result Value Ref Range Status   Specimen Description BLOOD LEFT WRIST  Final   Special Requests BOTTLES DRAWN AEROBIC AND ANAEROBIC 5CC EACH  Final   Culture   Final    NO GROWTH 3 DAYS Performed at [facility]    Report Status PENDING  Incomplete  MRSA PCR Screening     Status: None   Collection Time: 02/26/15 10:30 PM  Result Value Ref Range Status   MRSA by PCR NEGATIVE NEGATIVE Final    Comment:        The GeneXpert MRSA Assay (FDA approved  for NASAL specimens only), is one component of a comprehensive MRSA colonization surveillance program. It is not intended to diagnose MRSA infection nor to guide or monitor treatment for MRSA infections.   Culture, body fluid-bottle     Status: None (Preliminary result)   Collection Time: 02/27/15 11:19 AM  Result Value Ref Range Status   Specimen Description FLUID PLEURAL LEFT  Final   Special Requests BAA 10CCS  Final   Culture   Final    NO GROWTH 2 DAYS Performed at [facility]    Report Status PENDING  Incomplete  Gram stain     Status: None   Collection Time: 02/27/15 11:19 AM  Result Value Ref Range Status   Specimen Description FLUID PLEURAL LEFT  Final   Special Requests NONE  Final   Gram Stain   Final    MODERATE WBC PRESENT,BOTH PMN AND MONONUCLEAR NO ORGANISMS SEEN Performed at [facility]    Report Status 02/27/2015 FINAL  Final     Labs: Basic Metabolic Panel:  Recent Labs Lab 02/26/15 1626 02/27/15 0345 02/28/15 0821 03/02/15 0340  NA 131* 134* 139 136  K 4.9 4.2 3.5 3.2*  CL 98* 103 104 98*  CO2 26 26 28  33*  GLUCOSE 114* 169* 135* 107*  BUN 13 10 14 10   CREATININE 0.53 0.46 0.56 0.61  CALCIUM 8.4* 8.3* 8.4* 7.8*  MG 1.9  --   --   --   PHOS 3.0  --   --   --    Liver Function Tests:  Recent Labs Lab 02/26/15 1626 02/27/15 0345  AST 29 20  ALT 29 31  ALKPHOS 105 102  BILITOT 1.0 0.3  PROT 7.2 6.5  ALBUMIN 2.9* 2.5*  CBC:  Recent Labs Lab 02/26/15 1710 02/27/15 0345 02/28/15 0821 03/02/15 0340  WBC 11.5* 9.2 14.2* 8.6  NEUTROABS 9.5*  --   --   --   HGB 9.3* 9.5* 9.7* 9.2*  HCT 28.7* 29.6* 30.1* 29.7*  MCV 88.0 90.0 89.6  89.7  PLT 392 387 447* 381    BNP (last 3 results)  Recent Labs  02/26/15 1710  BNP 244.4*   SIGNED: Time coordinating discharge: 30 minutes  Faye Ramsay, MD  [facility] 03/02/2015, 12:32 PM Pager [phone]  If 7PM-7AM, please contact  night-coverage [email or web address] Password [email or web address]

## 2015-03-02 NOTE — Progress Notes (Signed)
SUBJECTIVE: Denies palpitations. Ate some of breakfast.      Intake/Output Summary (Last 24 hours) at 03/02/15 0800 Last data filed at 03/02/15 0600  Gross per 24 hour  Intake    840 ml  Output   2700 ml  Net  -1860 ml    Current Facility-Administered Medications  Medication Dose Route Frequency Provider Last Rate Last Dose  . acetaminophen (TYLENOL) tablet 1,000 mg  1,000 mg Oral Q6H PRN Oswald Hillock, MD   1,000 mg at 03/01/15 1808  . enoxaparin (LOVENOX) injection 40 mg  40 mg Subcutaneous Q24H Oswald Hillock, MD   40 mg at 03/01/15 2212  . feeding supplement (ENSURE ENLIVE) (ENSURE ENLIVE) liquid 237 mL  237 mL Oral BID BM Maricela Bo Ostheim, RD   237 mL at 03/01/15 1439  . guaiFENesin (MUCINEX) 12 hr tablet 600 mg  600 mg Oral BID Oswald Hillock, MD   600 mg at 03/01/15 2213  . levalbuterol (XOPENEX) nebulizer solution 0.63 mg  0.63 mg Nebulization Q6H Oswald Hillock, MD   0.63 mg at 03/02/15 0133  . levofloxacin (LEVAQUIN) tablet 500 mg  500 mg Oral Daily Theodis Blaze, MD      . LORazepam (ATIVAN) tablet 0.5 mg  0.5 mg Oral BID PRN Oswald Hillock, MD   0.5 mg at 03/02/15 0351  . metoprolol (LOPRESSOR) injection 5 mg  5 mg Intravenous Q4H PRN Theodis Blaze, MD   5 mg at 03/01/15 0757  . metoprolol tartrate (LOPRESSOR) tablet 100 mg  100 mg Oral BID Theodis Blaze, MD   100 mg at 03/01/15 2213  . ondansetron (ZOFRAN) tablet 4 mg  4 mg Oral Q6H PRN Oswald Hillock, MD       Or  . ondansetron (ZOFRAN) injection 4 mg  4 mg Intravenous Q6H PRN Oswald Hillock, MD      . potassium chloride (K-DUR,KLOR-CON) CR tablet 30 mEq  30 mEq Oral Q4H Theodis Blaze, MD      . predniSONE (DELTASONE) tablet 30 mg  30 mg Oral Q breakfast Theodis Blaze, MD      . sodium chloride 0.9 % bolus 500 mL  500 mL Intravenous Once Forde Dandy, MD   500 mL at 02/26/15 1727    Filed Vitals:   03/02/15 0400 03/02/15 0500 03/02/15 0600 03/02/15 0645  BP:    153/81  Pulse:      Temp:      TempSrc:      Resp: '31  25 26 28  '$ Height:      Weight:      SpO2: 94% 96% 94% 93%    PHYSICAL EXAM General: No acute distress Head: Normal. Lungs: Decreased breath sounds, +wheezes Heart: Tachycardic, normal S1/S2, no S3/S4, no murmurs Neck: No JVD. Abdomen: Non tender, no distention Extremities: No LE edema.  Neuro: Alert and oriented. Psych: Good affect, responds appropriately Skin: No rashes or lesions noted.   TELEMETRY: Reviewed telemetry pt in sinus tachycardia, frequent PAC's, paroxysms of [name] and MAT, occasional PVC's.  LABS: Basic Metabolic Panel:  Recent Labs  02/28/15 0821 03/02/15 0340  NA 139 136  K 3.5 3.2*  CL 104 98*  CO2 28 33*  GLUCOSE 135* 107*  BUN 14 10  CREATININE 0.56 0.61  CALCIUM 8.4* 7.8*   Liver Function Tests: No results for input(s): AST, ALT, ALKPHOS, BILITOT, PROT, ALBUMIN in the last 72 hours. No results  for input(s): LIPASE, AMYLASE in the last 72 hours. CBC:  Recent Labs  02/28/15 0821 03/02/15 0340  WBC 14.2* 8.6  HGB 9.7* 9.2*  HCT 30.1* 29.7*  MCV 89.6 89.7  PLT 447* 381   Cardiac Enzymes: No results for input(s): CKTOTAL, CKMB, CKMBINDEX, TROPONINI in the last 72 hours. BNP: Invalid input(s): POCBNP D-Dimer: No results for input(s): DDIMER in the last 72 hours. Hemoglobin A1C: No results for input(s): HGBA1C in the last 72 hours. Fasting Lipid Panel: No results for input(s): CHOL, HDL, LDLCALC, TRIG, CHOLHDL, LDLDIRECT in the last 72 hours. Thyroid Function Tests: No results for input(s): TSH, T4TOTAL, T3FREE, THYROIDAB in the last 72 hours.  Invalid input(s): FREET3 Anemia Panel: No results for input(s): VITAMINB12, FOLATE, FERRITIN, TIBC, IRON, RETICCTPCT in the last 72 hours.  RADIOLOGY: Dg Chest 1 View  02/27/2015  CLINICAL DATA:  Left pleural effusion.  Status post thoracentesis. EXAM: CHEST 1 VIEW COMPARISON:  Chest x-ray and chest CT dated 02/26/2015 FINDINGS: There has been slight reduction in the left pleural effusion.  Large left upper lobe mass appears unchanged. No pneumothorax. Right lung is clear. Chronic posterior right diaphragmatic hernia containing only fat as demonstrated on the prior CT scan. Pulmonary vascularity is normal.  Heart size is normal. IMPRESSION: No complicating features after left thoracentesis. Slight decrease in the pleural effusion. No pneumothorax. Electronically Signed   By: Lorriane Shire M.D.   On: 02/27/2015 12:02   Dg Chest 1 View  02/07/2015  CLINICAL DATA:  Status post chest biopsy. EXAM: CHEST 1 VIEW COMPARISON:  January 01, 2015. FINDINGS: Stable presence of large left upper lobe mass. No pneumothorax is seen. No significant pleural effusion is noted. Right lung is clear. IMPRESSION: No pneumothorax status post left lung biopsy. Electronically Signed   By: Marijo Conception, M.D.   On: 02/07/2015 13:28   Ct Angio Chest Pe W/cm &/or Wo Cm  02/26/2015  CLINICAL DATA:  Left upper lobe lung cancer, getting radiation, new left pleural effusion. Shortness of breath, fatigue, wheezing, productive cough. EXAM: CT ANGIOGRAPHY CHEST WITH CONTRAST TECHNIQUE: Multidetector CT imaging of the chest was performed using the standard protocol during bolus administration of intravenous contrast. Multiplanar CT image reconstructions and MIPs were obtained to evaluate the vascular anatomy. CONTRAST:  179m OMNIPAQUE IOHEXOL 350 MG/ML SOLN COMPARISON:  CTA chest dated 01/22/2015 FINDINGS: Limited evaluation due to respiratory motion. No evidence of pulmonary embolism to the lobar level. Mediastinum/Nodes: Heart is normal in size. No pericardial effusion. Coronary atherosclerosis. Atherosclerotic calcifications of the aortic arch. Prominent mediastinal lymph nodes, including a 10 mm short axis right paratracheal node (series 4/ image 32) and a 9 mm short axis subcarinal node (series 4/ image 38), grossly unchanged. Visualized thyroid is grossly unremarkable. Lungs/Pleura: Evaluation of lung parenchyma is  constrained by respiratory motion. 9.8 x 7.8 cm anterior left upper lobe mass which abuts the mediastinum (series 4/image 30), previously 11.0 x 7.8 cm, corresponding to known primary bronchogenic neoplasm. The mass extends inferiorly to the left hilar region (series 4/image 40). Associated small to moderate left pleural effusion, increased, previously a trace pleural effusion. Compressive atelectasis in the left lower lobe. Additional increased interstitial markings in the lingula and left lower lobe (series 7/image 42) are favored to reflect atelectasis or possibly radiation changes, lymphangitic spread of tumor considered less likely. Right posterior Bochdalek's hernia. No pneumothorax. Upper abdomen: Visualized upper abdomen is grossly unremarkable. The patient's known right extrarenal pelvis is minimally visualized on the bottom most  slice (series 4/ image 83). The prior hypervascular splenic lesion is not apparent on the current study. Musculoskeletal: Mild degenerative changes of the visualized thoracolumbar spine. Review of the MIP images confirms the above findings. IMPRESSION: Motion degraded images. No evidence of pulmonary embolism to the lobar level. 9.8 x 7.8 cm left upper lobe mass, corresponding to known primary bronchogenic neoplasm, decreased. Small to moderate left pleural effusion, increased. Associated compressive atelectasis and suspected radiation changes in the left lower lobe and lingula. Mild mediastinal lymphadenopathy, grossly unchanged. Additional ancillary findings as above. Electronically Signed   By: Julian Hy M.D.   On: 02/26/2015 20:44   Ct Biopsy  02/07/2015  CLINICAL DATA:  81 year old female with a history of a left lung mass. The patient has undergone a prior CT-guided biopsy performed 01/01/2015 with atelectatic lung result. She returns today for repeat biopsy. EXAM: CT-GUIDED BIOPSY LEFT LUNG MASS MEDICATIONS AND MEDICAL HISTORY: Versed 1.0 mg, Fentanyl 25 mcg.  Additional Medications: None. ANESTHESIA/SEDATION: Moderate sedation time: 15 minutes PROCEDURE: The procedure, risks, benefits, and alternatives were explained to the patient and the patient's family. Specific risks that were addressed included bleeding, infection, pneumothorax, need for further procedure including chest tube placement, chance of delayed pneumothorax or hemorrhage, hemoptysis, nondiagnostic sample, cardiopulmonary collapse, death. Questions regarding the procedure were encouraged and answered. The patient understands and consents to the procedure. Patient was positioned in the supine position on the CT gantry table and a scout CT of the chest was performed for planning purposes. Once angle of approach was determined, the skin and subcutaneous tissues this scan was prepped and draped in the usual sterile fashion, and a sterile drape was applied covering the operative field. A sterile gown and sterile gloves were used for the procedure. Local anesthesia was provided with 1% Lidocaine. The skin and subcutaneous tissues were infiltrated 1% lidocaine for local anesthesia, and a small stab incision was made with an 11 blade scalpel. Using CT guidance, a 17 gauge trocar needle was advanced into the left suprahilartarget. After confirmation of the tip, separate 18 gauge core biopsies were performed. These were placed into solution for transportation to the lab. A final CT image was performed. Patient tolerated the procedure well and remained hemodynamically stable throughout. No complications were encountered and no significant blood loss was encounter Patient tolerated the procedure well and remained hemodynamically stable throughout. No complications were encountered and no significant blood loss was encountered. FINDINGS: CT image demonstrates left-sided lung lesion associated with the mediastinum, similar to comparison studies. Images during the case demonstrate navigation of the needle tip into the  FDG avid material in the left suprahilar region, with correlation to the PET-CT 12/21/2014. Final image demonstrates no complicating features. COMPLICATIONS: None IMPRESSION: Status post CT-guided biopsy of left suprahilar lung mass. Tissue specimen sent to pathology for complete histopathologic analysis. Signed, Dulcy Fanny. Earleen Newport, DO [facility] Radiology Electronically Signed   By: Corrie Mckusick D.O.   On: 02/07/2015 16:16   Dg Chest Port 1 View  03/01/2015  CLINICAL DATA:  Shortness of Breath EXAM: PORTABLE CHEST 1 VIEW COMPARISON:  February 27, 2015 chest radiograph and chest CT February 26, 2015 FINDINGS: There has been reaccumulation of pleural fluid on the left. There is opacification of most of the left hemithorax due to a combination of the large left upper lobe mass, effusion, and associated atelectasis/consolidation. In the right lung base, there is airspace consolidation which is slightly increased compared to recent prior study. There  is stable elevation left hemidiaphragm. Heart is prominent but stable. The prominence in the left hilar region is stable, due primarily to the mass with associated adenopathy. No new adenopathy compared to recent prior study. Bones are osteoporotic. IMPRESSION: Opacification of most of the left hemithorax trauma due to reaccumulation of pleural fluid associated atelectasis/ consolidation. Large left upper lobe mass contributes to this opacification as does volume loss on the left with elevation of the left hemidiaphragm. There is increase in airspace disease in the right base, most likely pneumonia. No change in cardiac silhouette. Electronically Signed   By: Lowella Grip III M.D.   On: 03/01/2015 07:22   Dg Chest Portable 1 View  02/26/2015  CLINICAL DATA:  Acute shortness of breath. Known left upper lobe cancer. EXAM: PORTABLE CHEST 1 VIEW COMPARISON:  02/07/2015 and prior studies FINDINGS: A new small to moderate  left pleural effusion and left lower lung consolidations/ atelectasis noted. A left upper lobe mass is again identified. Mild right basilar atelectasis/ airspace disease identified. There is no evidence of pneumothorax. IMPRESSION: New left pleural effusion and left lower lung consolidation/atelectasis. Mild right basilar atelectasis/airspace disease. Left upper lobe mass/malignancy again identified. Electronically Signed   By: Margarette Canada M.D.   On: 02/26/2015 16:31   US Thoracentesis Asp Pleural Space W/img Guide  02/27/2015  INDICATION: Left lung cancer, dyspnea, left pleural effusion. Request is made for diagnostic and therapeutic left thoracentesis. EXAM: ULTRASOUND GUIDED DIAGNOSTIC AND THERAPEUTIC LEFT THORACENTESIS COMPARISON:  None. MEDICATIONS: None COMPLICATIONS: None immediate TECHNIQUE: Informed written consent was obtained from the patient after a discussion of the risks, benefits and alternatives to treatment. A timeout was performed prior to the initiation of the procedure. Initial ultrasound scanning demonstrates a moderate left pleural effusion. The lower chest was prepped and draped in the usual sterile fashion. 1% lidocaine was used for local anesthesia. An ultrasound image was saved for documentation purposes. A 6 Fr Safe-T-Centesis catheter was introduced. The thoracentesis was performed. The catheter was removed and a dressing was applied. The patient tolerated the procedure well without immediate post procedural complication. The patient was escorted to have an upright chest radiograph. FINDINGS: A total of approximately 500 cc of hazy, yellow fluid was removed. Requested samples were sent to the laboratory. IMPRESSION: Successful ultrasound-guided diagnostic and therapeutic left sided thoracentesis yielding 500 cc of pleural fluid. Read by: Rowe Robert, PA-C Electronically Signed   By: Markus Daft M.D.   On: 02/27/2015 11:24      ASSESSMENT AND PLAN: 1. Tachycardia: Evidence for  sinus tachycardia, atrial tachycardia, and multifocal atrial tachycardia. No atrial fibrillation. As stated by Dr. Percival Spanish, being driven by underlying illness. Patient is asymptomatic. Now on metoprolol 100 mg bid. Would not adjust at this time. Could consider calcium channel blocker in the future but not at present. No further recommendations. Will sign off.   Kate Sable, M.D., F.A.C.C.

## 2015-03-02 NOTE — Progress Notes (Signed)
[place] ICU Electrolyte Replacement Protocol  Patient Name: Lindsey Fleming DOB: 1928-04-29 MRN: [id number]  Date of Service  03/02/2015   HPI/Events of Note    Recent Labs Lab 02/26/15 1626 02/27/15 0345 02/28/15 0821 03/02/15 0340  NA 131* 134* 139 136  K 4.9 4.2 3.5 3.2*  CL 98* 103 104 98*  CO2 26 26 28  33*  GLUCOSE 114* 169* 135* 107*  BUN 13 10 14 10   CREATININE 0.53 0.46 0.56 0.61  CALCIUM 8.4* 8.3* 8.4* 7.8*  MG 1.9  --   --   --   PHOS 3.0  --   --   --     Estimated Creatinine Clearance: 50.6 mL/min (by C-G formula based on Cr of 0.61).  Intake/Output      10/14 0701 - 10/15 0700   P.O. 460   IV Piggyback 50   Total Intake(mL/kg) 510 (7.2)   Urine (mL/kg/hr) 1900 (1.1)   Total Output 1900   Net -1390        - I/O DETAILED x24h    Total I/O In: 110 [P.O.:60; IV Piggyback:50] Out: 650 [Urine:650] - I/O THIS SHIFT    ASSESSMENT   eICURN Interventions  Electrolyte protocol criteria met. Irregular labs replaced per protocol. MD notified.   ASSESSMENT: MAJOR ELECTROLYTE    Lorene Dy 03/02/2015, 5:35 AM

## 2015-03-03 LAB — CULTURE, BLOOD (ROUTINE X 2)
CULTURE: NO GROWTH
Culture: NO GROWTH

## 2015-03-03 MED ORDER — POTASSIUM CHLORIDE CRYS ER 20 MEQ PO TBCR
40.0000 meq | EXTENDED_RELEASE_TABLET | Freq: Once | ORAL | Status: AC
  Administered 2015-03-03: 40 meq via ORAL
  Filled 2015-03-03: qty 2

## 2015-03-03 NOTE — Progress Notes (Signed)
  Radiation Oncology         [phone] ________________________________  Name: Lindsey Fleming MRN: [id number]  Date: 02/26/2015  DOB: 1928-02-28  End of Treatment Note   ICD-9-CM ICD-10-CM    1. 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  Her performance status gradually declined, and she developed a symptomatic left pleural effusion.  She was admitted at that time, and treatment completed.  She was discharged with Hospice care.  Plan: The patient can follow-up as needed, however, she will likely follow with hospice only. ________________________________  Sheral Apley Tammi Klippel, M.D.

## 2015-03-03 NOTE — Progress Notes (Signed)
Patient discharged to home, all discharge medications and instructions reviewed with patient's daughter. Patient to be assisted to vehicle by wheelchair.

## 2015-03-03 NOTE — Progress Notes (Signed)
Pt seen and examined at bedside, wants to go home this AM. Please see d/c summary for detail. Pt will go home with hospice.   Faye Ramsay, MD  [facility] Pager [phone]  If 7PM-7AM, please contact night-coverage [email or web address] Password [email or web address]

## 2015-03-03 NOTE — Plan of Care (Signed)
Problem: ICU Phase Progression Outcomes Goal: Dyspnea controlled at rest Outcome: Not Progressing Pt elected for hospice care, no further treatments

## 2015-03-04 LAB — CULTURE, BODY FLUID-BOTTLE: CULTURE: NO GROWTH

## 2015-03-04 LAB — CULTURE, BODY FLUID W GRAM STAIN -BOTTLE

## 2015-03-05 ENCOUNTER — Ambulatory Visit: Payer: Medicare Other

## 2015-03-18 ENCOUNTER — Telehealth: Payer: Self-pay | Admitting: Internal Medicine

## 2015-03-18 NOTE — Telephone Encounter (Signed)
Patient dtr called to cx appointments - not r/s at this time. Desk nurse informed.

## 2015-03-19 ENCOUNTER — Other Ambulatory Visit: Payer: Medicare Other

## 2015-03-19 ENCOUNTER — Ambulatory Visit (HOSPITAL_COMMUNITY): Payer: Medicare Other

## 2015-03-25 ENCOUNTER — Ambulatory Visit: Payer: Medicare Other | Admitting: Internal Medicine

## 2015-05-01 ENCOUNTER — Telehealth: Payer: Self-pay | Admitting: Radiation Oncology

## 2015-05-01 NOTE — Telephone Encounter (Signed)
Phoned to inquire about patient's status. Spoke with her daughter, [name]. She reports hospice continues to visit her mother once a week. She reports he mother remains weak but, only occasionally has to use her oxygen now. Denies any additional needs at this time. Expressed appreciation for the call.

## 2015-12-17 ENCOUNTER — Other Ambulatory Visit (HOSPITAL_COMMUNITY): Payer: Self-pay | Admitting: Geriatric Medicine

## 2015-12-17 DIAGNOSIS — C3492 Malignant neoplasm of unspecified part of left bronchus or lung: Secondary | ICD-10-CM

## 2015-12-18 ENCOUNTER — Ambulatory Visit (HOSPITAL_COMMUNITY)
Admission: RE | Admit: 2015-12-18 | Discharge: 2015-12-18 | Disposition: A | Discharge status: Home / Self Care | Source: Ambulatory Visit | Attending: Geriatric Medicine | Admitting: Geriatric Medicine

## 2015-12-18 DIAGNOSIS — R938 Abnormal findings on diagnostic imaging of other specified body structures: Secondary | ICD-10-CM | POA: Insufficient documentation

## 2015-12-18 DIAGNOSIS — C3492 Malignant neoplasm of unspecified part of left bronchus or lung: Secondary | ICD-10-CM | POA: Insufficient documentation

## 2016-09-14 ENCOUNTER — Other Ambulatory Visit: Payer: Self-pay | Admitting: Family Medicine

## 2016-09-14 ENCOUNTER — Ambulatory Visit
Admission: RE | Admit: 2016-09-14 | Discharge: 2016-09-14 | Disposition: A | Discharge status: Home / Self Care | Payer: Medicare Other | Source: Ambulatory Visit | Attending: Family Medicine | Admitting: Family Medicine

## 2016-09-14 DIAGNOSIS — Z85118 Personal history of other malignant neoplasm of bronchus and lung: Secondary | ICD-10-CM

## 2016-09-14 DIAGNOSIS — M79602 Pain in left arm: Secondary | ICD-10-CM

## 2016-09-25 ENCOUNTER — Ambulatory Visit (INDEPENDENT_AMBULATORY_CARE_PROVIDER_SITE_OTHER): Payer: Self-pay | Admitting: Orthopaedic Surgery

## 2016-09-29 ENCOUNTER — Ambulatory Visit (INDEPENDENT_AMBULATORY_CARE_PROVIDER_SITE_OTHER): Payer: Self-pay | Admitting: Orthopaedic Surgery

## 2016-10-06 ENCOUNTER — Ambulatory Visit (INDEPENDENT_AMBULATORY_CARE_PROVIDER_SITE_OTHER): Payer: Medicare Other | Admitting: Orthopaedic Surgery

## 2016-10-06 ENCOUNTER — Encounter (INDEPENDENT_AMBULATORY_CARE_PROVIDER_SITE_OTHER): Payer: Self-pay | Admitting: Orthopaedic Surgery

## 2016-10-06 DIAGNOSIS — M25512 Pain in left shoulder: Secondary | ICD-10-CM | POA: Diagnosis not present

## 2016-10-06 DIAGNOSIS — G8929 Other chronic pain: Secondary | ICD-10-CM | POA: Diagnosis not present

## 2016-10-06 MED ORDER — LIDOCAINE HCL 1 % IJ SOLN
3.0000 mL | INTRAMUSCULAR | Status: AC | PRN
  Administered 2016-10-06: 3 mL

## 2016-10-06 MED ORDER — METHYLPREDNISOLONE ACETATE 40 MG/ML IJ SUSP
40.0000 mg | INTRAMUSCULAR | Status: AC | PRN
  Administered 2016-10-06: 40 mg via INTRA_ARTICULAR

## 2016-10-06 MED ORDER — BUPIVACAINE HCL 0.5 % IJ SOLN
3.0000 mL | INTRAMUSCULAR | Status: AC | PRN
  Administered 2016-10-06: 3 mL via INTRA_ARTICULAR

## 2016-10-06 NOTE — Progress Notes (Signed)
Office Visit Note   Patient: Lindsey Fleming           Date of Birth: 24-Sep-1927           MRN: [id number] Visit Date: 10/06/2016              Requested by: Gaynelle Arabian, MD [address] [place], [place] [address] PCP: Gaynelle Arabian, MD   Assessment & Plan: Visit Diagnoses:  1. Chronic left shoulder pain     Plan: Impression is rotator cuff tendinosis with impingement. Subacromial injection was provided today. Follow-up with me as needed.  Follow-Up Instructions: Return if symptoms worsen or fail to improve.   Orders:  No orders of the defined types were placed in this encounter.  No orders of the defined types were placed in this encounter.     Procedures: Large Joint Inj Date/Time: 10/06/2016 4:13 PM Performed by: Leandrew Koyanagi Authorized by: Leandrew Koyanagi   Consent Given by:  Patient Timeout: prior to procedure the correct patient, procedure, and site was verified   Location:  Shoulder Site:  L subacromial bursa Prep: patient was prepped and draped in usual sterile fashion   Needle Size:  22 G Approach:  Posterior Ultrasound Guidance: No   Fluoroscopic Guidance: No   Arthrogram: No   Medications:  3 mL lidocaine 1 %; 3 mL bupivacaine 0.5 %; 40 mg methylPREDNISolone acetate 40 MG/ML     Clinical Data: No additional findings.   Subjective: Chief Complaint  Patient presents with  . Neck - Pain    Patient is a 83 year old female who is on chronic oxygen for lung cancer and is on hospice comes in with left shoulder and neck pain recently. She denies any injuries. The pain comes and goes and is worse with use of her arm and elevation of the arm. Pain does not radiate down into her hand.    Review of Systems  Constitutional: Negative.   HENT: Negative.   Eyes: Negative.   Respiratory: Negative.   Cardiovascular: Negative.   Endocrine: Negative.   Musculoskeletal: Negative.   Neurological: Negative.   Hematological: Negative.     Psychiatric/Behavioral: Negative.   All other systems reviewed and are negative.    Objective: Vital Signs: There were no vitals taken for this visit.  Physical Exam  Constitutional: She is oriented to person, place, and time. She appears well-developed and well-nourished.  HENT:  Head: Normocephalic and atraumatic.  Eyes: EOM are normal.  Neck: Neck supple.  Pulmonary/Chest: Effort normal.  Abdominal: Soft.  Neurological: She is alert and oriented to person, place, and time.  Skin: Skin is warm. Capillary refill takes less than 2 seconds.  Psychiatric: She has a normal mood and affect. Her behavior is normal. Judgment and thought content normal.  Nursing note and vitals reviewed.   Ortho Exam Left shoulder exam shows positive impingement signs. Rotator cuff testing is intact but with significant pain. She has no radicular symptoms. Specialty Comments:  No specialty comments available.  Imaging: No results found.   PMFS History: Patient Active Problem List   Diagnosis Date Noted  . PAT (paroxysmal atrial tachycardia) ([place]) 03/01/2015  . Dyspnea   . Palliative care encounter   . Primary cancer of left upper lobe of lung ([place]) 02/27/2015  . Acute respiratory failure with hypoxia ([place]) 02/27/2015  . Sepsis due to pneumonia ([place]) 02/27/2015  . Anemia of chronic disease 02/27/2015  . Acute hyponatremia 02/27/2015  . Recurrent pleural  effusion on left 02/27/2015  . Squamous cell carcinoma lung ([place]) 02/27/2015   Past Medical History:  Diagnosis Date  . Cataract    bilateral  . H/O hernia repair   . Lung cancer ([place])    suspected stage III non small cell lung cancer     Family History  Problem Relation Age of Onset  . Stroke Father   . Cancer Sister   . Cancer Brother     Past Surgical History:  Procedure Laterality Date  . ABDOMINAL HYSTERECTOMY    . BLADDER SUSPENSION    . CATARACT EXTRACTION Bilateral   . HERNIA REPAIR    . VIDEO BRONCHOSCOPY Bilateral  12/04/2014   Procedure: VIDEO BRONCHOSCOPY WITH FLUORO;  Surgeon: Rigoberto Noel, MD;  Location: [facility];  Service: Cardiopulmonary;  Laterality: Bilateral;   Social History   Occupational History  . Not on file.   Social History Main Topics  . Smoking status: Former Smoker    Packs/day: 0.50    Years: 10.00    Quit date: 11/27/1972  . Smokeless tobacco: Never Used  . Alcohol use No  . Drug use: No  . Sexual activity: No

## 2016-10-14 IMAGING — CT CT BIOPSY
1 series · 14 of 16 positions shown, 20 images · non-contrast
Comparison: none

CLINICAL DATA: Left upper lobe lung mass with mediastinal invasion

[Series 4: (hospital) 6.0 b30f · axial · 1.13mm/px · z∈[-12,-6]mm · 14 of 16 slices shown, 20 images]
[im 2/16  soft-tissue]
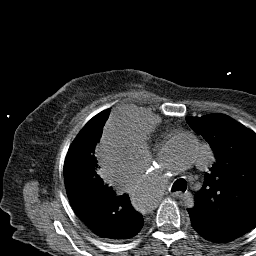
[im 2/16  bone]
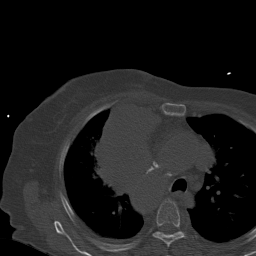
[im 3/16  soft-tissue]
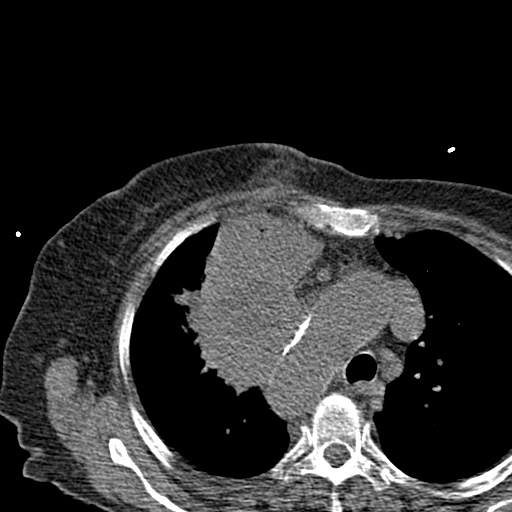
[im 4/16  soft-tissue]
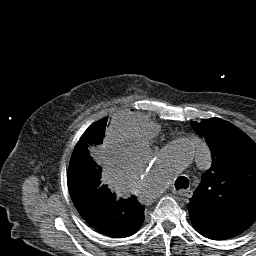
[im 5/16  soft-tissue]
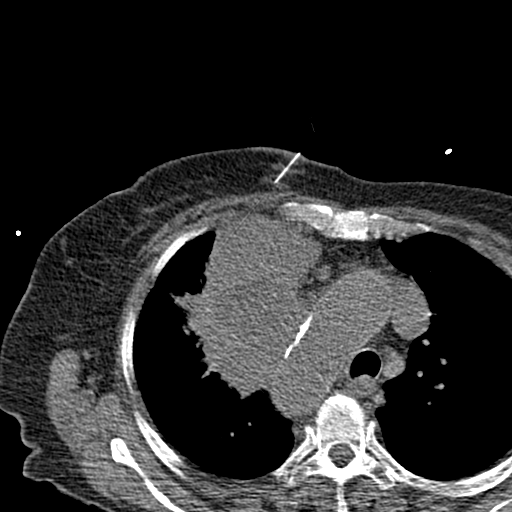
[im 6/16  soft-tissue]
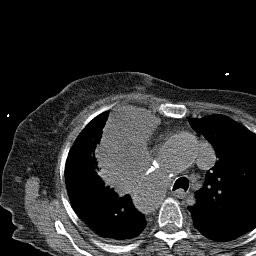
[im 7/16  soft-tissue]
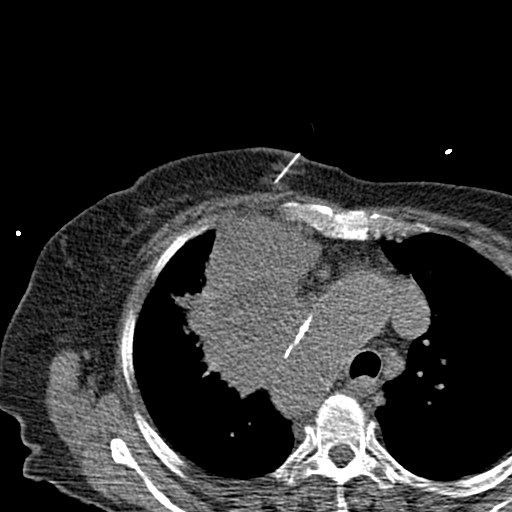
[im 8/16  soft-tissue]
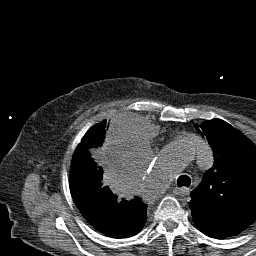
[im 9/16  soft-tissue]
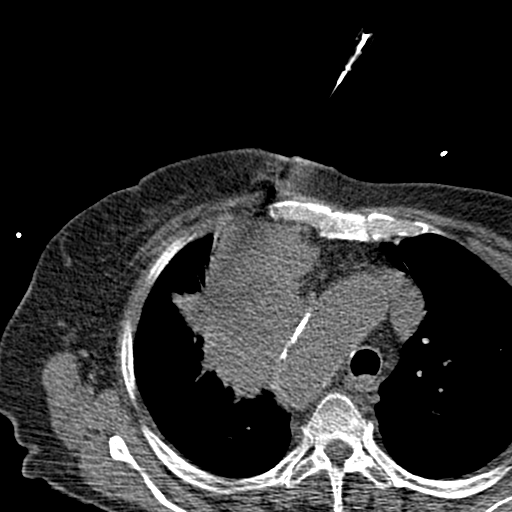
[im 9/16  lung]
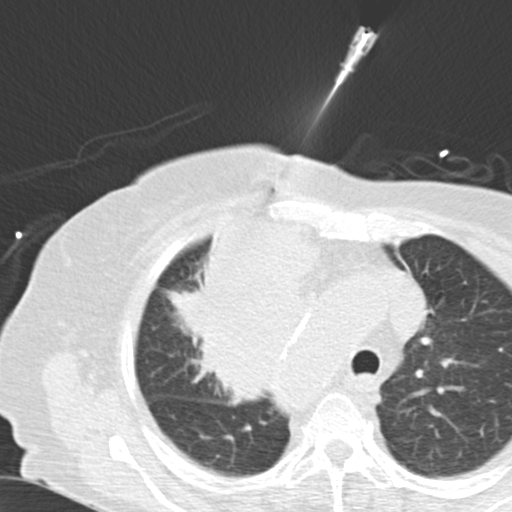
[im 10/16  soft-tissue]
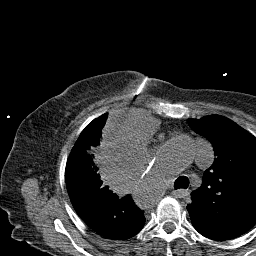
[im 10/16  lung]
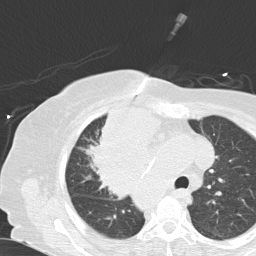
[im 10/16  bone]
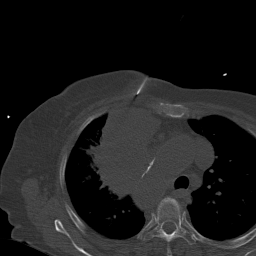
[im 11/16  soft-tissue]
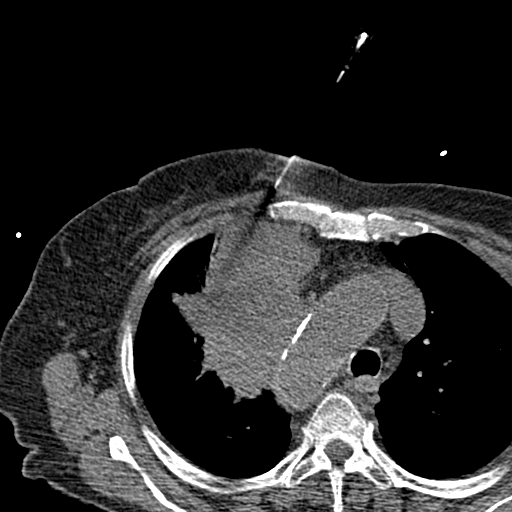
[im 11/16  lung]
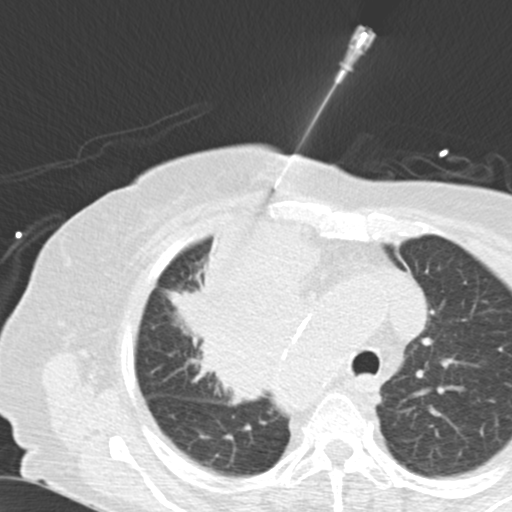
[im 12/16  soft-tissue]
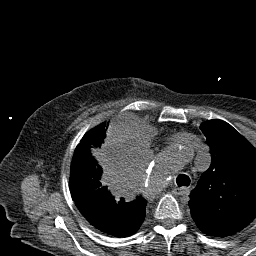
[im 12/16  lung]
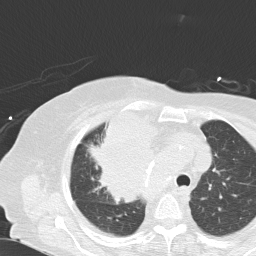
[im 13/16  soft-tissue]
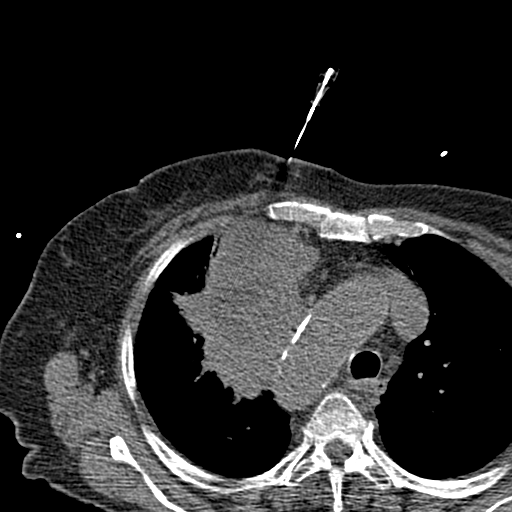
[im 14/16  soft-tissue]
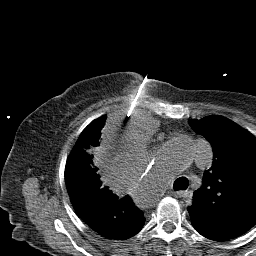
[im 15/16  soft-tissue]
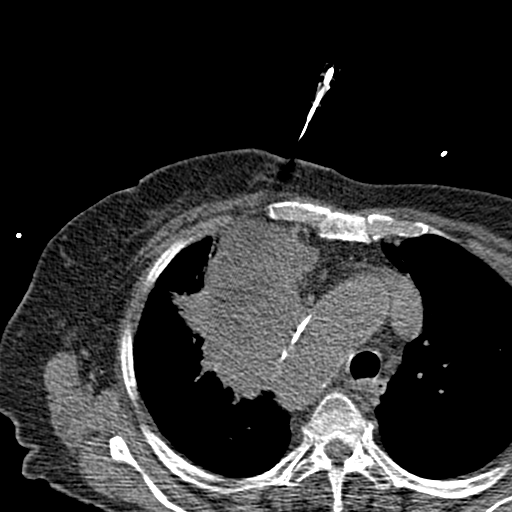

[14 of 16 positions shown; findings below may reference images not displayed]

EXAM:
CT GUIDED CORE BIOPSY OF LEFT UPPER LOBE LUNG MASS

ANESTHESIA/SEDATION:
Intravenous Fentanyl and Versed were administered as conscious
sedation during continuous cardiorespiratory monitoring by the
radiology RN, with a total moderate sedation time of four minutes.

PROCEDURE:
The procedure risks, benefits, and alternatives were explained to
the patient. Questions regarding the procedure were encouraged and
answered. The patient understands and consents to the procedure.

Patient placed supine. Select axial scans through the thorax were
obtained. The lesion was localized and an appropriate skin entry
site was determined and marked.

The operative field was prepped with Betadinein a sterile fashion,
and a sterile drape was applied covering the operative field. A
sterile gown and sterile gloves were used for the procedure. Local
anesthesia was provided with 1% Lidocaine.

Under CT fluoroscopic guidance, a 17 gauge trocar needle was
advanced to the margin of the lesion. Once needle tip position was
confirmed, coaxial 18-gauge core biopsy samples were obtained,
submitted in formalin to surgical pathology. The guide needle was
removed. Postprocedure scans show no pneumothorax or significant
regional alveolar hemorrhage.

COMPLICATIONS:
None immediate
FINDINGS: Anterior left upper lobe mass with mediastinal invasion again
localized. CT-guided core biopsy samples were obtained.
IMPRESSION: 1. Technically successful CT-guided core biopsy of left upper lobe
lung mass.

## 2016-11-20 IMAGING — CR DG CHEST 1V
1 series · 1 of 1 positions shown · non-contrast
Comparison: January 01, 2015.

CLINICAL DATA: Status post chest biopsy.

EXAM:
CHEST 1 VIEW

[w chest pa]
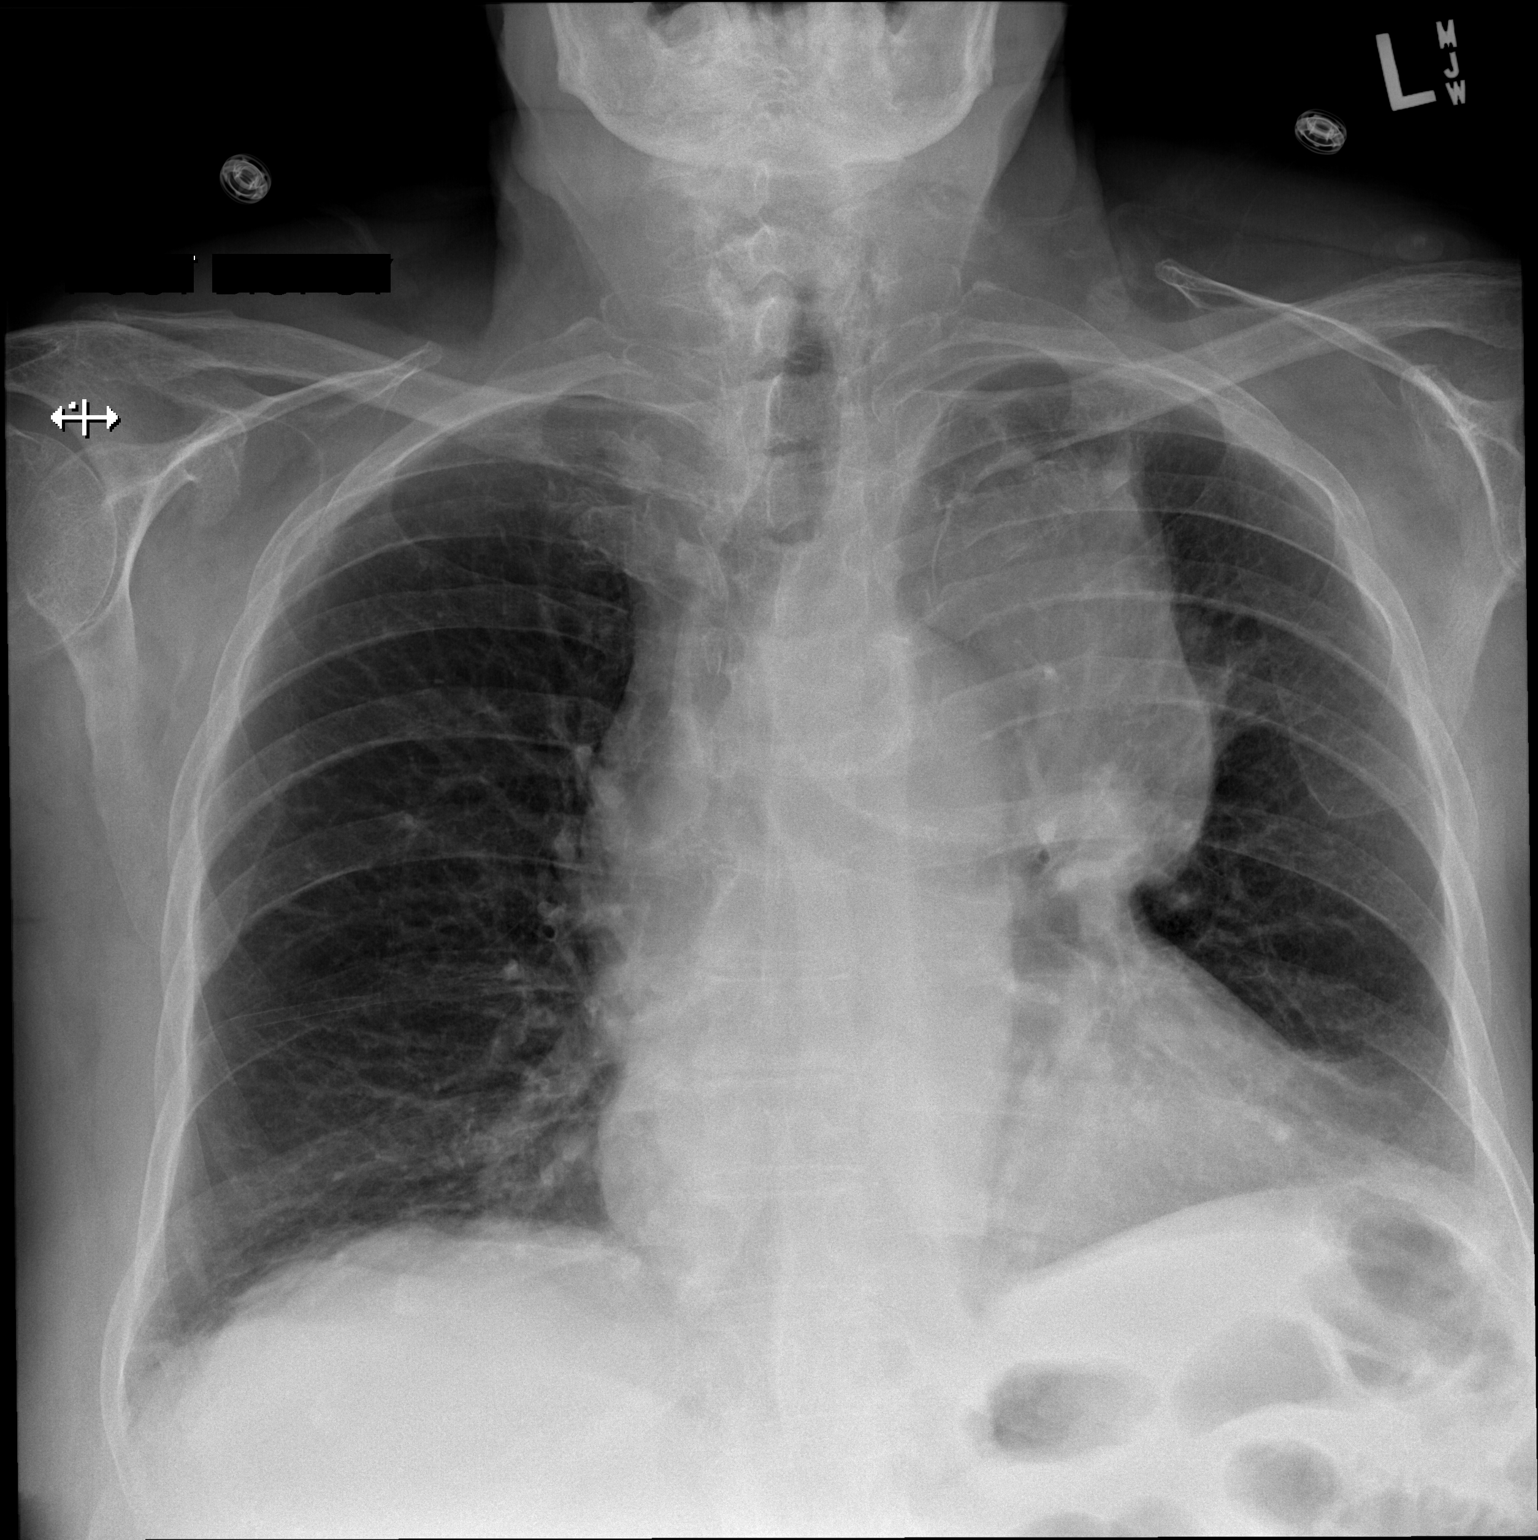

[1 of 1 positions shown; findings below may reference images not displayed]

FINDINGS: Stable presence of large left upper lobe mass. No pneumothorax is
seen. No significant pleural effusion is noted. Right lung is clear.
IMPRESSION: No pneumothorax status post left lung biopsy.

## 2017-03-18 DEATH — deceased
